# Patient Record
Sex: Female | Born: 1965
Health system: Southern US, Community
[De-identification: ages and names within clinical notes are randomized; demographics above are authoritative.]

## PROBLEM LIST (undated history)

## (undated) DIAGNOSIS — I10 Essential (primary) hypertension: Secondary | ICD-10-CM

## (undated) DIAGNOSIS — K5792 Diverticulitis of intestine, part unspecified, without perforation or abscess without bleeding: Secondary | ICD-10-CM

## (undated) DIAGNOSIS — G4730 Sleep apnea, unspecified: Secondary | ICD-10-CM

## (undated) DIAGNOSIS — J45909 Unspecified asthma, uncomplicated: Secondary | ICD-10-CM

## (undated) HISTORY — PX: TEMPOROMANDIBULAR JOINT SURGERY: SHX35

## (undated) HISTORY — PX: APPENDECTOMY: SHX54

## (undated) HISTORY — PX: OTHER SURGICAL HISTORY: SHX169

## (undated) HISTORY — PX: CHOLECYSTECTOMY: SHX55

---

## 1991-10-15 DIAGNOSIS — J45909 Unspecified asthma, uncomplicated: Secondary | ICD-10-CM | POA: Insufficient documentation

## 2015-05-17 HISTORY — PX: NECK SURGERY: SHX720

## 2018-11-22 DIAGNOSIS — E785 Hyperlipidemia, unspecified: Secondary | ICD-10-CM | POA: Diagnosis not present

## 2018-11-22 DIAGNOSIS — G4733 Obstructive sleep apnea (adult) (pediatric): Secondary | ICD-10-CM | POA: Diagnosis not present

## 2018-11-22 DIAGNOSIS — I10 Essential (primary) hypertension: Secondary | ICD-10-CM | POA: Diagnosis not present

## 2018-11-22 DIAGNOSIS — D509 Iron deficiency anemia, unspecified: Secondary | ICD-10-CM | POA: Diagnosis not present

## 2018-11-22 DIAGNOSIS — R7301 Impaired fasting glucose: Secondary | ICD-10-CM | POA: Diagnosis not present

## 2018-11-22 MED FILL — CARTIA XT 180 MG CAPSULE SA: 180 | 90 days supply | Qty: 90 | Fill #0

## 2018-12-17 ENCOUNTER — Other Ambulatory Visit: Payer: Self-pay

## 2018-12-17 DIAGNOSIS — Z20822 Contact with and (suspected) exposure to covid-19: Secondary | ICD-10-CM

## 2018-12-17 DIAGNOSIS — R6889 Other general symptoms and signs: Secondary | ICD-10-CM | POA: Diagnosis not present

## 2018-12-18 LAB — NOVEL CORONAVIRUS, NAA: SARS-CoV-2, NAA: NOT DETECTED

## 2019-01-07 MED FILL — CARTIA XT 180 MG CAPSULE SA: 180 | 90 days supply | Qty: 90 | Fill #0

## 2019-01-14 ENCOUNTER — Other Ambulatory Visit (HOSPITAL_COMMUNITY)
Admission: RE | Admit: 2019-01-14 | Discharge: 2019-01-14 | Disposition: A | Discharge status: Home / Self Care | Payer: 59 | Source: Ambulatory Visit | Attending: Internal Medicine | Admitting: Internal Medicine

## 2019-01-14 ENCOUNTER — Other Ambulatory Visit: Payer: Self-pay

## 2019-01-14 DIAGNOSIS — R7301 Impaired fasting glucose: Secondary | ICD-10-CM | POA: Diagnosis not present

## 2019-01-14 DIAGNOSIS — D509 Iron deficiency anemia, unspecified: Secondary | ICD-10-CM | POA: Diagnosis not present

## 2019-01-14 LAB — GLUCOSE, RANDOM: Glucose, Bld: 103 mg/dL — ABNORMAL HIGH (ref 70–99)

## 2019-01-14 LAB — CBC
HCT: 38 % (ref 36.0–46.0)
Hemoglobin: 11.7 g/dL — ABNORMAL LOW (ref 12.0–15.0)
MCH: 25.5 pg — ABNORMAL LOW (ref 26.0–34.0)
MCHC: 30.8 g/dL (ref 30.0–36.0)
MCV: 83 fL (ref 80.0–100.0)
Platelets: 278 10*3/uL (ref 150–400)
RBC: 4.58 MIL/uL (ref 3.87–5.11)
RDW: 18.1 % — ABNORMAL HIGH (ref 11.5–15.5)
WBC: 6.3 10*3/uL (ref 4.0–10.5)
nRBC: 0 % (ref 0.0–0.2)

## 2019-01-14 LAB — FERRITIN: Ferritin: 16 ng/mL (ref 11–307)

## 2019-01-14 LAB — HEMOGLOBIN A1C
Hgb A1c MFr Bld: 6 % — ABNORMAL HIGH (ref 4.8–5.6)
Mean Plasma Glucose: 125.5 mg/dL

## 2019-01-15 DIAGNOSIS — R7301 Impaired fasting glucose: Secondary | ICD-10-CM | POA: Diagnosis not present

## 2019-01-15 DIAGNOSIS — D259 Leiomyoma of uterus, unspecified: Secondary | ICD-10-CM | POA: Diagnosis not present

## 2019-01-15 DIAGNOSIS — D509 Iron deficiency anemia, unspecified: Secondary | ICD-10-CM | POA: Diagnosis not present

## 2019-03-26 MED FILL — CARTIA XT 180 MG CAPSULE SA: 180 | 90 days supply | Qty: 90 | Fill #1

## 2019-04-19 ENCOUNTER — Other Ambulatory Visit (HOSPITAL_COMMUNITY)
Admission: RE | Admit: 2019-04-19 | Discharge: 2019-04-19 | Disposition: A | Discharge status: Home / Self Care | Payer: 59 | Source: Ambulatory Visit | Attending: Internal Medicine | Admitting: Internal Medicine

## 2019-04-19 DIAGNOSIS — D509 Iron deficiency anemia, unspecified: Secondary | ICD-10-CM | POA: Diagnosis not present

## 2019-04-19 DIAGNOSIS — E785 Hyperlipidemia, unspecified: Secondary | ICD-10-CM | POA: Insufficient documentation

## 2019-04-19 DIAGNOSIS — J45909 Unspecified asthma, uncomplicated: Secondary | ICD-10-CM | POA: Diagnosis not present

## 2019-04-19 LAB — CBC
HCT: 38 % (ref 36.0–46.0)
Hemoglobin: 12.3 g/dL (ref 12.0–15.0)
MCH: 28.1 pg (ref 26.0–34.0)
MCHC: 32.4 g/dL (ref 30.0–36.0)
MCV: 86.8 fL (ref 80.0–100.0)
Platelets: 260 10*3/uL (ref 150–400)
RBC: 4.38 MIL/uL (ref 3.87–5.11)
RDW: 13.5 % (ref 11.5–15.5)
WBC: 5.9 10*3/uL (ref 4.0–10.5)
nRBC: 0 % (ref 0.0–0.2)

## 2019-04-19 LAB — HEMOGLOBIN A1C
Hgb A1c MFr Bld: 6.1 % — ABNORMAL HIGH (ref 4.8–5.6)
Mean Plasma Glucose: 128.37 mg/dL

## 2019-04-19 LAB — FERRITIN: Ferritin: 27 ng/mL (ref 11–307)

## 2019-04-23 DIAGNOSIS — Z6833 Body mass index (BMI) 33.0-33.9, adult: Secondary | ICD-10-CM | POA: Diagnosis not present

## 2019-04-23 DIAGNOSIS — R7301 Impaired fasting glucose: Secondary | ICD-10-CM | POA: Diagnosis not present

## 2019-04-23 DIAGNOSIS — N926 Irregular menstruation, unspecified: Secondary | ICD-10-CM | POA: Diagnosis not present

## 2019-04-23 DIAGNOSIS — Z01419 Encounter for gynecological examination (general) (routine) without abnormal findings: Secondary | ICD-10-CM | POA: Diagnosis not present

## 2019-04-23 DIAGNOSIS — D509 Iron deficiency anemia, unspecified: Secondary | ICD-10-CM | POA: Diagnosis not present

## 2019-04-23 MED FILL — NORETHINDRONE 5 MG TABLET: 5 | 90 days supply | Qty: 180 | Fill #0

## 2019-04-23 MED FILL — NORETHINDRONE 5 MG TABLET: 5 | 90 days supply | Qty: 180 | Fill #0 | Status: TO

## 2019-06-17 ENCOUNTER — Other Ambulatory Visit: Payer: Self-pay

## 2019-06-17 ENCOUNTER — Other Ambulatory Visit: Payer: Self-pay | Admitting: Internal Medicine

## 2019-06-17 ENCOUNTER — Other Ambulatory Visit (HOSPITAL_COMMUNITY): Payer: Self-pay | Admitting: Internal Medicine

## 2019-06-17 ENCOUNTER — Ambulatory Visit (HOSPITAL_COMMUNITY)
Admission: RE | Admit: 2019-06-17 | Discharge: 2019-06-17 | Disposition: A | Discharge status: Home / Self Care | Payer: 59 | Source: Ambulatory Visit | Attending: Internal Medicine | Admitting: Internal Medicine

## 2019-06-17 DIAGNOSIS — K5720 Diverticulitis of large intestine with perforation and abscess without bleeding: Secondary | ICD-10-CM | POA: Diagnosis not present

## 2019-06-17 DIAGNOSIS — K5732 Diverticulitis of large intestine without perforation or abscess without bleeding: Secondary | ICD-10-CM | POA: Diagnosis not present

## 2019-06-17 DIAGNOSIS — K5792 Diverticulitis of intestine, part unspecified, without perforation or abscess without bleeding: Secondary | ICD-10-CM

## 2019-06-17 DIAGNOSIS — D259 Leiomyoma of uterus, unspecified: Secondary | ICD-10-CM | POA: Diagnosis not present

## 2019-06-17 LAB — POCT I-STAT CREATININE: Creatinine, Ser: 0.9 mg/dL (ref 0.44–1.00)

## 2019-06-17 MED ORDER — IOHEXOL 300 MG/ML  SOLN
100.0000 mL | Freq: Once | INTRAMUSCULAR | Status: AC | PRN
  Administered 2019-06-17: 100 mL via INTRAVENOUS

## 2019-06-17 MED ORDER — IOHEXOL 9 MG/ML PO SOLN: 500.0000 mL | ORAL | Status: AC

## 2019-07-12 MED FILL — DILTIAZEM HCL ER COATED BEA: 180 | 90 days supply | Qty: 90 | Fill #2

## 2019-08-12 MED FILL — NORETHINDRONE 5 MG TABLET: 5 | 90 days supply | Qty: 180 | Fill #1

## 2019-08-19 ENCOUNTER — Other Ambulatory Visit: Payer: Self-pay

## 2019-08-19 ENCOUNTER — Other Ambulatory Visit (HOSPITAL_COMMUNITY)
Admission: RE | Admit: 2019-08-19 | Discharge: 2019-08-19 | Disposition: A | Discharge status: Home / Self Care | Payer: 59 | Source: Ambulatory Visit | Attending: Internal Medicine | Admitting: Internal Medicine

## 2019-08-19 DIAGNOSIS — D509 Iron deficiency anemia, unspecified: Secondary | ICD-10-CM | POA: Diagnosis not present

## 2019-08-19 LAB — CBC
HCT: 41.7 % (ref 36.0–46.0)
Hemoglobin: 13.6 g/dL (ref 12.0–15.0)
MCH: 28.8 pg (ref 26.0–34.0)
MCHC: 32.6 g/dL (ref 30.0–36.0)
MCV: 88.3 fL (ref 80.0–100.0)
Platelets: 292 10*3/uL (ref 150–400)
RBC: 4.72 MIL/uL (ref 3.87–5.11)
RDW: 13 % (ref 11.5–15.5)
WBC: 5.3 10*3/uL (ref 4.0–10.5)
nRBC: 0 % (ref 0.0–0.2)

## 2019-08-19 LAB — FERRITIN: Ferritin: 25 ng/mL (ref 11–307)

## 2019-08-22 DIAGNOSIS — B009 Herpesviral infection, unspecified: Secondary | ICD-10-CM | POA: Diagnosis not present

## 2019-08-22 DIAGNOSIS — D509 Iron deficiency anemia, unspecified: Secondary | ICD-10-CM | POA: Diagnosis not present

## 2019-10-11 ENCOUNTER — Telehealth: Payer: Self-pay | Admitting: Obstetrics & Gynecology

## 2019-10-11 NOTE — Telephone Encounter (Signed)
Left voicemail for patient to notify her that we have canceled her pap/phys on 6/1 due to some unforseen changes in the schedule; in needing to make room for high risk OB apts. Asked patient to contact the office at her earliest convenience to r/s the apt.

## 2019-10-15 ENCOUNTER — Other Ambulatory Visit: Payer: 59 | Admitting: Obstetrics & Gynecology

## 2019-10-27 ENCOUNTER — Emergency Department (HOSPITAL_COMMUNITY)
Admission: EM | Admit: 2019-10-27 | Discharge: 2019-10-27 | Disposition: A | Discharge status: Home / Self Care | Payer: 59 | Attending: Emergency Medicine | Admitting: Emergency Medicine

## 2019-10-27 ENCOUNTER — Other Ambulatory Visit: Payer: Self-pay

## 2019-10-27 ENCOUNTER — Encounter (HOSPITAL_COMMUNITY): Payer: Self-pay | Admitting: Emergency Medicine

## 2019-10-27 DIAGNOSIS — K5792 Diverticulitis of intestine, part unspecified, without perforation or abscess without bleeding: Secondary | ICD-10-CM | POA: Insufficient documentation

## 2019-10-27 DIAGNOSIS — J45909 Unspecified asthma, uncomplicated: Secondary | ICD-10-CM | POA: Diagnosis not present

## 2019-10-27 DIAGNOSIS — Z87891 Personal history of nicotine dependence: Secondary | ICD-10-CM | POA: Insufficient documentation

## 2019-10-27 DIAGNOSIS — R1032 Left lower quadrant pain: Secondary | ICD-10-CM | POA: Diagnosis present

## 2019-10-27 DIAGNOSIS — I10 Essential (primary) hypertension: Secondary | ICD-10-CM | POA: Insufficient documentation

## 2019-10-27 HISTORY — DX: Essential (primary) hypertension: I10

## 2019-10-27 HISTORY — DX: Diverticulitis of intestine, part unspecified, without perforation or abscess without bleeding: K57.92

## 2019-10-27 HISTORY — DX: Unspecified asthma, uncomplicated: J45.909

## 2019-10-27 HISTORY — DX: Sleep apnea, unspecified: G47.30

## 2019-10-27 LAB — CBC WITH DIFFERENTIAL/PLATELET
Abs Immature Granulocytes: 0.03 10*3/uL (ref 0.00–0.07)
Basophils Absolute: 0.1 10*3/uL (ref 0.0–0.1)
Basophils Relative: 1 %
Eosinophils Absolute: 0.2 10*3/uL (ref 0.0–0.5)
Eosinophils Relative: 2 %
HCT: 41.3 % (ref 36.0–46.0)
Hemoglobin: 13.7 g/dL (ref 12.0–15.0)
Immature Granulocytes: 0 %
Lymphocytes Relative: 19 %
Lymphs Abs: 2.1 10*3/uL (ref 0.7–4.0)
MCH: 28.9 pg (ref 26.0–34.0)
MCHC: 33.2 g/dL (ref 30.0–36.0)
MCV: 87.1 fL (ref 80.0–100.0)
Monocytes Absolute: 0.7 10*3/uL (ref 0.1–1.0)
Monocytes Relative: 7 %
Neutro Abs: 7.8 10*3/uL — ABNORMAL HIGH (ref 1.7–7.7)
Neutrophils Relative %: 71 %
Platelets: 282 10*3/uL (ref 150–400)
RBC: 4.74 MIL/uL (ref 3.87–5.11)
RDW: 12.9 % (ref 11.5–15.5)
WBC: 10.9 10*3/uL — ABNORMAL HIGH (ref 4.0–10.5)
nRBC: 0 % (ref 0.0–0.2)

## 2019-10-27 LAB — BASIC METABOLIC PANEL
Anion gap: 9 (ref 5–15)
BUN: 12 mg/dL (ref 6–20)
CO2: 24 mmol/L (ref 22–32)
Calcium: 8.9 mg/dL (ref 8.9–10.3)
Chloride: 105 mmol/L (ref 98–111)
Creatinine, Ser: 0.89 mg/dL (ref 0.44–1.00)
GFR calc Af Amer: >60 mL/min (ref >60)
GFR calc non Af Amer: >60 mL/min (ref >60)
Glucose, Bld: 115 mg/dL — ABNORMAL HIGH (ref 70–99)
Potassium: 3.5 mmol/L (ref 3.5–5.1)
Sodium: 138 mmol/L (ref 135–145)

## 2019-10-27 MED ORDER — METRONIDAZOLE IN NACL 5-0.79 MG/ML-% IV SOLN
500.0000 mg | Freq: Once | INTRAVENOUS | Status: AC
  Administered 2019-10-27: 500 mg via INTRAVENOUS
  Filled 2019-10-27: qty 100

## 2019-10-27 MED ORDER — METRONIDAZOLE 500 MG PO TABS: 500.0000 mg | ORAL_TABLET | Freq: Three times a day (TID) | ORAL | 2 refills | Status: DC

## 2019-10-27 MED ORDER — ONDANSETRON HCL 4 MG PO TABS: 4.0000 mg | ORAL_TABLET | Freq: Four times a day (QID) | ORAL | 0 refills | Status: DC | PRN

## 2019-10-27 MED ORDER — CIPROFLOXACIN HCL 500 MG PO TABS: 500.0000 mg | ORAL_TABLET | Freq: Two times a day (BID) | ORAL | 2 refills | Status: DC

## 2019-10-27 MED ORDER — LACTATED RINGERS IV BOLUS
1000.0000 mL | Freq: Once | INTRAVENOUS | Status: AC
  Administered 2019-10-27: 1000 mL via INTRAVENOUS

## 2019-10-27 MED ORDER — CIPROFLOXACIN IN D5W 400 MG/200ML IV SOLN
400.0000 mg | Freq: Once | INTRAVENOUS | Status: AC
  Administered 2019-10-27: 400 mg via INTRAVENOUS
  Filled 2019-10-27: qty 200

## 2019-10-27 MED ORDER — KETOROLAC TROMETHAMINE 30 MG/ML IJ SOLN
30.0000 mg | Freq: Once | INTRAMUSCULAR | Status: AC
  Administered 2019-10-27: 30 mg via INTRAVENOUS
  Filled 2019-10-27: qty 1

## 2019-10-27 NOTE — ED Triage Notes (Signed)
Pt with LLQ abdominal pain that has started to spread across lower abdomen. Pt believes her "diverticulitis is back".

## 2019-10-27 NOTE — ED Provider Notes (Signed)
[facility] EMERGENCY DEPARTMENT Provider Note   CSN: [id number] Arrival date & time: 10/27/19  5027     History Chief Complaint  Patient presents with  . Abdominal Pain    Gina Whitaker is a 54 y.o. female.  Patient presents to the emergency department for evaluation of abdominal pain.  Patient reports that she started experiencing lower back pain several days ago which she initially thought might have been a strain from moving patients at work.  She then, however, started having urinary frequency and bladder irritation followed by onset of left lower quadrant abdominal pain.  Patient reports that she now recalls that this is the same sequence she experienced when she had her first episode of diverticulitis in February of this year.  Patient reports that the pain has progressively worsened, a burning, sharp pain mostly in the left lower quadrant that radiates across to the right.  Patient experiencing nausea associated with the pain.        Past Medical History:  Diagnosis Date  . Asthma   . Diverticulitis   . Hypertension   . Sleep apnea     There are no problems to display for this patient.   Past Surgical History:  Procedure Laterality Date  . APPENDECTOMY    . CHOLECYSTECTOMY       OB History   No obstetric history on file.     History reviewed. No pertinent family history.  Social History   Tobacco Use  . Smoking status: Former Smoker    Types: Cigarettes    Quit date: 1994    Years since quitting: 27.4  . Smokeless tobacco: Never Used  Vaping Use  . Vaping Use: Never used  Substance Use Topics  . Alcohol use: Never  . Drug use: Never    Home Medications Prior to Admission medications   Medication Sig Start Date End Date Taking? Authorizing Provider  ciprofloxacin (CIPRO) 500 MG tablet Take 1 tablet (500 mg total) by mouth 2 (two) times daily. 10/27/19   Orpah Greek, MD  metroNIDAZOLE (FLAGYL) 500 MG tablet Take 1 tablet (500 mg  total) by mouth 3 (three) times daily. 10/27/19   Orpah Greek, MD  ondansetron (ZOFRAN) 4 MG tablet Take 1 tablet (4 mg total) by mouth every 6 (six) hours as needed for nausea or vomiting. 10/27/19   Erbie Arment, Gwenyth Allegra, MD    Allergies    Tramadol and Codeine  Review of Systems   Review of Systems  Gastrointestinal: Positive for abdominal pain and nausea.  All other systems reviewed and are negative.   Physical Exam Updated Vital Signs LMP 10/13/2019 (Approximate)   Physical Exam Vitals and nursing note reviewed.  Constitutional:      General: She is not in acute distress.    Appearance: Normal appearance. She is well-developed.  HENT:     Head: Normocephalic and atraumatic.     Right Ear: Hearing normal.     Left Ear: Hearing normal.     Nose: Nose normal.  Eyes:     Conjunctiva/sclera: Conjunctivae normal.     Pupils: Pupils are equal, round, and reactive to light.  Cardiovascular:     Rate and Rhythm: Regular rhythm.     Heart sounds: S1 normal and S2 normal. No murmur heard.  No friction rub. No gallop.   Pulmonary:     Effort: Pulmonary effort is normal. No respiratory distress.     Breath sounds: Normal breath sounds.  Chest:  Chest wall: No tenderness.  Abdominal:     General: Bowel sounds are normal.     Palpations: Abdomen is soft.     Tenderness: There is abdominal tenderness in the left lower quadrant. There is guarding. There is no rebound. Negative signs include Murphy's sign and McBurney's sign.     Hernia: No hernia is present.  Musculoskeletal:        General: Normal range of motion.     Cervical back: Normal range of motion and neck supple.  Skin:    General: Skin is warm and dry.     Findings: No rash.  Neurological:     Mental Status: She is alert and oriented to person, place, and time.     GCS: GCS eye subscore is 4. GCS verbal subscore is 5. GCS motor subscore is 6.     Cranial Nerves: No cranial nerve deficit.     Sensory:  No sensory deficit.     Coordination: Coordination normal.  Psychiatric:        Speech: Speech normal.        Behavior: Behavior normal.        Thought Content: Thought content normal.     ED Results / Procedures / Treatments   Labs (all labs ordered are listed, but only abnormal results are displayed) Labs Reviewed  CBC WITH DIFFERENTIAL/PLATELET  BASIC METABOLIC PANEL  URINALYSIS, ROUTINE W REFLEX MICROSCOPIC    EKG None  Radiology No results found.  Procedures Procedures (including critical care time)  Medications Ordered in ED Medications  lactated ringers bolus 1,000 mL (has no administration in time range)  ketorolac (TORADOL) 30 MG/ML injection 30 mg (has no administration in time range)  ciprofloxacin (CIPRO) IVPB 400 mg (has no administration in time range)  metroNIDAZOLE (FLAGYL) IVPB 500 mg (has no administration in time range)    ED Course  I have reviewed the triage vital signs and the nursing notes.  Pertinent labs & imaging results that were available during my care of the patient were reviewed by me and considered in my medical decision making (see chart for details).    MDM Rules/Calculators/A&P                          Patient presents to the emergency department for evaluation of left lower quadrant abdominal pain.  Symptoms are consistent with acute diverticulitis.  Discussed risks and benefits of CT scan.  Symptoms have been present for approximately 2 days and therefore I doubt perforation or abscess formation.  She does not have any signs of peritonitis on examination.  She would prefer not to have a CAT scan to confirm diagnosis at this time.  We will therefore empirically treat with Cipro and Flagyl, analgesia.  Patient will return to the ER for further evaluation if pain does not improve or her symptoms worsen.  Final Clinical Impression(s) / ED Diagnoses Final diagnoses:  Diverticulitis    Rx / DC Orders ED Discharge Orders          Ordered    ciprofloxacin (CIPRO) 500 MG tablet  2 times daily     Discontinue  Reprint     10/27/19 0600    metroNIDAZOLE (FLAGYL) 500 MG tablet  3 times daily     Discontinue  Reprint     10/27/19 0600    ondansetron (ZOFRAN) 4 MG tablet  Every 6 hours PRN     Discontinue  Reprint  10/27/19 0600           Orpah Greek, MD 10/27/19 0600

## 2019-10-31 DIAGNOSIS — K5700 Diverticulitis of small intestine with perforation and abscess without bleeding: Secondary | ICD-10-CM | POA: Diagnosis not present

## 2019-11-05 ENCOUNTER — Encounter: Payer: Self-pay | Admitting: Obstetrics & Gynecology

## 2019-11-05 ENCOUNTER — Ambulatory Visit (INDEPENDENT_AMBULATORY_CARE_PROVIDER_SITE_OTHER): Payer: 59 | Admitting: Obstetrics & Gynecology

## 2019-11-05 VITALS — BP 131/67 | HR 80 | Ht 65.0 in | Wt 183.5 lb

## 2019-11-05 DIAGNOSIS — N951 Menopausal and female climacteric states: Secondary | ICD-10-CM | POA: Diagnosis not present

## 2019-11-05 MED ORDER — ESTRADIOL 0.1 MG/24HR TD PTTW: 1.0000 | MEDICATED_PATCH | TRANSDERMAL | 12 refills | Status: DC

## 2019-11-05 MED ORDER — PROGESTERONE 200 MG PO CAPS: 200.0000 mg | ORAL_CAPSULE | Freq: Every day | ORAL | 11 refills | Status: DC

## 2019-11-05 MED FILL — PROGESTERONE MICRONIZED 200: 200 | 30 days supply | Qty: 30 | Fill #0

## 2019-11-05 MED FILL — ESTRADIOL 0.1 MG PATCH: 0.1 | 28 days supply | Qty: 8 | Fill #0

## 2019-11-05 NOTE — Progress Notes (Signed)
Chief Complaint  Patient presents with  . Gynecologic Exam      54 y.o. [id number] Patient's last menstrual period was 09/21/2019. The current method of family planning is vasectomy.  Outpatient Encounter Medications as of 11/05/2019  Medication Sig  . acyclovir (ZOVIRAX) 400 MG tablet Take 400 mg by mouth 5 (five) times daily.  Marland Kitchen albuterol (VENTOLIN HFA) 108 (90 Base) MCG/ACT inhaler Inhale into the lungs.  . cetirizine (ZYRTEC) 10 MG tablet Take 10 mg by mouth daily.  . ciprofloxacin (CIPRO) 500 MG tablet Take 1 tablet (500 mg total) by mouth 2 (two) times daily.  Marland Kitchen diltiazem (CARDIZEM CD) 180 MG 24 hr capsule Take 180 mg by mouth daily.  . Famotidine (PEPCID PO) Take by mouth in the morning.  . fluticasone (FLONASE) 50 MCG/ACT nasal spray 2 sprays by Each Nare route daily.  . metroNIDAZOLE (FLAGYL) 500 MG tablet Take 1 tablet (500 mg total) by mouth 3 (three) times daily.  . norethindrone (AYGESTIN) 5 MG tablet Take 10 mg by mouth daily.  . ondansetron (ZOFRAN) 4 MG tablet Take 1 tablet (4 mg total) by mouth every 6 (six) hours as needed for nausea or vomiting.  . rizatriptan (MAXALT) 10 MG tablet Take 10 mg by mouth daily as needed.  Derrill Memo ON 11/07/2019] estradiol (VIVELLE-DOT) 0.1 MG/24HR patch Place 1 patch (0.1 mg total) onto the skin 2 (two) times a week.  . progesterone (PROMETRIUM) 200 MG capsule Take 1 capsule (200 mg total) by mouth daily. nightly   No facility-administered encounter medications on file as of 11/05/2019.    Subjective Pt has approx 2 years of vasomotor symptoms emotional changes sleep disturbance sexaual  Dysfunction Had an ablation 3/20 for perimenopausal bleeding issues, hemoglobin 7 Symptoms seem to be increasing and not waning Also with SUI and some pad related vulvitis  Past Medical History:  Diagnosis Date  . Asthma   . Diverticulitis   . Hypertension   . Sleep apnea     Past Surgical History:  Procedure Laterality Date  .  APPENDECTOMY    . CHOLECYSTECTOMY    . endometrial ablation      OB History    Gravida  3   Para  2   Term  1   Preterm  1   AB  1   Living  2     SAB  1   TAB      Ectopic      Multiple      Live Births  2           Allergies  Allergen Reactions  . Hydrocodone Rash and Swelling  . Tramadol     Rash and facial swelling  . Codeine     Rash and Itching  . Erythromycin   . Morphine   . Penicillins Rash    Social History   Socioeconomic History  . Marital status: Married    Spouse name: Not on file  . Number of children: Not on file  . Years of education: Not on file  . Highest education level: Not on file  Occupational History  . Not on file  Tobacco Use  . Smoking status: Former Smoker    Types: Cigarettes    Quit date: 1994    Years since quitting: 27.4  . Smokeless tobacco: Never Used  Vaping Use  . Vaping Use: Never used  Substance and Sexual Activity  . Alcohol use: Never  . Drug use:  Never  . Sexual activity: Not Currently    Birth control/protection: Surgical    Comment: ablation  Other Topics Concern  . Not on file  Social History Narrative  . Not on file   Social Determinants of Health   Financial Resource Strain: Low Risk   . Difficulty of Paying Living Expenses: Not hard at all  Food Insecurity: No Food Insecurity  . Worried About Charity fundraiser in the Last Year: Never true  . Ran Out of Food in the Last Year: Never true  Transportation Needs: No Transportation Needs  . Lack of Transportation (Medical): No  . Lack of Transportation (Non-Medical): No  Physical Activity: Insufficiently Active  . Days of Exercise per Week: 2 days  . Minutes of Exercise per Session: 20 min  Stress: No Stress Concern Present  . Feeling of Stress : Only a little  Social Connections: Moderately Integrated  . Frequency of Communication with Friends and Family: Twice a week  . Frequency of Social Gatherings with Friends and Family: Once a  week  . Attends Religious Services: More than 4 times per year  . Active Member of Clubs or Organizations: No  . Attends Archivist Meetings: Never  . Marital Status: Married    Family History  Problem Relation Age of Onset  . Congestive Heart Failure Paternal Grandfather   . Cancer Paternal Grandmother        pancreatic  . Aneurysm Maternal Grandmother   . Heart attack Maternal Grandfather   . Hypertension Father   . Basal cell carcinoma Father   . Cancer Father        prostate  . Hypertension Mother   . Melanoma Mother   . High Cholesterol Mother   . High Cholesterol Brother   . Other Brother   . Asthma Daughter   . Polycystic ovary syndrome Daughter     Medications:       Current Outpatient Medications:  .  acyclovir (ZOVIRAX) 400 MG tablet, Take 400 mg by mouth 5 (five) times daily., Disp: , Rfl:  .  albuterol (VENTOLIN HFA) 108 (90 Base) MCG/ACT inhaler, Inhale into the lungs., Disp: , Rfl:  .  cetirizine (ZYRTEC) 10 MG tablet, Take 10 mg by mouth daily., Disp: , Rfl:  .  ciprofloxacin (CIPRO) 500 MG tablet, Take 1 tablet (500 mg total) by mouth 2 (two) times daily., Disp: 20 tablet, Rfl: 2 .  diltiazem (CARDIZEM CD) 180 MG 24 hr capsule, Take 180 mg by mouth daily., Disp: , Rfl:  .  Famotidine (PEPCID PO), Take by mouth in the morning., Disp: , Rfl:  .  fluticasone (FLONASE) 50 MCG/ACT nasal spray, 2 sprays by Each Nare route daily., Disp: , Rfl:  .  metroNIDAZOLE (FLAGYL) 500 MG tablet, Take 1 tablet (500 mg total) by mouth 3 (three) times daily., Disp: 30 tablet, Rfl: 2 .  norethindrone (AYGESTIN) 5 MG tablet, Take 10 mg by mouth daily., Disp: , Rfl:  .  ondansetron (ZOFRAN) 4 MG tablet, Take 1 tablet (4 mg total) by mouth every 6 (six) hours as needed for nausea or vomiting., Disp: 20 tablet, Rfl: 0 .  rizatriptan (MAXALT) 10 MG tablet, Take 10 mg by mouth daily as needed., Disp: , Rfl:  .  [START ON 11/07/2019] estradiol (VIVELLE-DOT) 0.1 MG/24HR patch,  Place 1 patch (0.1 mg total) onto the skin 2 (two) times a week., Disp: 8 patch, Rfl: 12 .  progesterone (PROMETRIUM) 200 MG capsule, Take 1 capsule (  200 mg total) by mouth daily. nightly, Disp: 20 capsule, Rfl: 11  Objective Blood pressure 131/67, pulse 80, height 5\' 5"  (1.651 m), weight 183 lb 8 oz (83.2 kg), last menstrual period 09/21/2019.  General WDWN female NAD Vulva:  normal appearing vulva with no masses, tenderness or lesions Vagina:  normal mucosa, no discharge Cervix:  Normal no lesions Uterus:  normal size, contour, position, consistency, mobility, non-tender Adnexa: ovaries:present,  normal adnexa in size, nontender and no masses   Pertinent ROS Per HPI  Labs or studies No new    Impression Diagnoses this Encounter::   ICD-10-CM   1. Perimenopausal symptoms  N95.1    2 years of vasomotor symptoms, night sweats, sleep disturbance and sexual dysfunction    Established relevant diagnosis(es):   Plan/Recommendations: Meds ordered this encounter  Medications  . estradiol (VIVELLE-DOT) 0.1 MG/24HR patch    Sig: Place 1 patch (0.1 mg total) onto the skin 2 (two) times a week.    Dispense:  8 patch    Refill:  12  . progesterone (PROMETRIUM) 200 MG capsule    Sig: Take 1 capsule (200 mg total) by mouth daily. nightly    Dispense:  20 capsule    Refill:  11    Labs or Scans Ordered: No orders of the defined types were placed in this encounter.   Management:: Topical ERT + prometrium 200 qhs Topical zinc oxide for pad related vulvitis Hopefully ERT will help that as well, indirectly thru improved tissue    Follow up Return in about 3 months (around 02/05/2020) for [place] visit, with Dr Elonda Husky.   All questions were answered.

## 2019-11-28 ENCOUNTER — Encounter (INDEPENDENT_AMBULATORY_CARE_PROVIDER_SITE_OTHER): Payer: Self-pay | Admitting: *Deleted

## 2019-12-10 MED FILL — PROGESTERONE MICRONIZED 200: 200 | 30 days supply | Qty: 30 | Fill #1

## 2019-12-10 MED FILL — ESTRADIOL 0.1 MG PATCH: 0.1 | 28 days supply | Qty: 8 | Fill #1

## 2019-12-17 ENCOUNTER — Other Ambulatory Visit (HOSPITAL_COMMUNITY)
Admission: RE | Admit: 2019-12-17 | Discharge: 2019-12-17 | Disposition: A | Discharge status: Home / Self Care | Payer: 59 | Source: Ambulatory Visit | Attending: Internal Medicine | Admitting: Internal Medicine

## 2019-12-17 DIAGNOSIS — R7301 Impaired fasting glucose: Secondary | ICD-10-CM | POA: Diagnosis not present

## 2019-12-17 DIAGNOSIS — D509 Iron deficiency anemia, unspecified: Secondary | ICD-10-CM | POA: Diagnosis not present

## 2019-12-17 DIAGNOSIS — E785 Hyperlipidemia, unspecified: Secondary | ICD-10-CM | POA: Diagnosis not present

## 2019-12-17 LAB — COMPREHENSIVE METABOLIC PANEL
ALT: 18 U/L (ref 0–44)
AST: 12 U/L — ABNORMAL LOW (ref 15–41)
Albumin: 3.8 g/dL (ref 3.5–5.0)
Alkaline Phosphatase: 51 U/L (ref 38–126)
Anion gap: 8 (ref 5–15)
BUN: 15 mg/dL (ref 6–20)
CO2: 24 mmol/L (ref 22–32)
Calcium: 8.6 mg/dL — ABNORMAL LOW (ref 8.9–10.3)
Chloride: 107 mmol/L (ref 98–111)
Creatinine, Ser: 0.83 mg/dL (ref 0.44–1.00)
GFR calc Af Amer: >60 mL/min (ref >60)
GFR calc non Af Amer: >60 mL/min (ref >60)
Glucose, Bld: 98 mg/dL (ref 70–99)
Potassium: 3.9 mmol/L (ref 3.5–5.1)
Sodium: 139 mmol/L (ref 135–145)
Total Bilirubin: 0.7 mg/dL (ref 0.3–1.2)
Total Protein: 6.7 g/dL (ref 6.5–8.1)

## 2019-12-17 LAB — CBC
HCT: 39 % (ref 36.0–46.0)
Hemoglobin: 12.6 g/dL (ref 12.0–15.0)
MCH: 28.5 pg (ref 26.0–34.0)
MCHC: 32.3 g/dL (ref 30.0–36.0)
MCV: 88.2 fL (ref 80.0–100.0)
Platelets: 265 10*3/uL (ref 150–400)
RBC: 4.42 MIL/uL (ref 3.87–5.11)
RDW: 13.2 % (ref 11.5–15.5)
WBC: 5.7 10*3/uL (ref 4.0–10.5)
nRBC: 0 % (ref 0.0–0.2)

## 2019-12-17 LAB — URINALYSIS, ROUTINE W REFLEX MICROSCOPIC
Bilirubin Urine: NEGATIVE
Glucose, UA: NEGATIVE mg/dL
Ketones, ur: NEGATIVE mg/dL
Leukocytes,Ua: NEGATIVE
Nitrite: NEGATIVE
Protein, ur: 30 mg/dL — AB
Specific Gravity, Urine: 1.024 (ref 1.005–1.030)
pH: 7 (ref 5.0–8.0)

## 2019-12-17 LAB — FERRITIN: Ferritin: 20 ng/mL (ref 11–307)

## 2019-12-17 LAB — HEMOGLOBIN A1C
Hgb A1c MFr Bld: 5.8 % — ABNORMAL HIGH (ref 4.8–5.6)
Mean Plasma Glucose: 119.76 mg/dL

## 2019-12-17 LAB — LIPID PANEL
Cholesterol: 194 mg/dL (ref 0–200)
HDL: 35 mg/dL — ABNORMAL LOW (ref >40)
LDL Cholesterol: 132 mg/dL — ABNORMAL HIGH (ref 0–99)
Total CHOL/HDL Ratio: 5.5 RATIO
Triglycerides: 137 mg/dL (ref <150)
VLDL: 27 mg/dL (ref 0–40)

## 2019-12-17 LAB — ETHANOL: Alcohol, Ethyl (B): <10 mg/dL (ref <10)

## 2019-12-26 DIAGNOSIS — I10 Essential (primary) hypertension: Secondary | ICD-10-CM | POA: Diagnosis not present

## 2019-12-26 DIAGNOSIS — G4733 Obstructive sleep apnea (adult) (pediatric): Secondary | ICD-10-CM | POA: Diagnosis not present

## 2019-12-26 DIAGNOSIS — R7309 Other abnormal glucose: Secondary | ICD-10-CM | POA: Diagnosis not present

## 2019-12-26 DIAGNOSIS — R945 Abnormal results of liver function studies: Secondary | ICD-10-CM | POA: Diagnosis not present

## 2020-01-02 ENCOUNTER — Other Ambulatory Visit (HOSPITAL_COMMUNITY): Payer: Self-pay | Admitting: Internal Medicine

## 2020-01-02 MED FILL — CARTIA XT 180 MG CAPSULE SA: 180 | 90 days supply | Qty: 90 | Fill #0

## 2020-01-03 DIAGNOSIS — G4733 Obstructive sleep apnea (adult) (pediatric): Secondary | ICD-10-CM | POA: Diagnosis not present

## 2020-01-07 MED FILL — PROGESTERONE MICRONIZED 200: 200 | 30 days supply | Qty: 30 | Fill #2

## 2020-01-07 MED FILL — ESTRADIOL 0.1 MG PATCH: 0.1 | 28 days supply | Qty: 8 | Fill #2

## 2020-01-13 ENCOUNTER — Telehealth (HOSPITAL_COMMUNITY): Payer: Self-pay

## 2020-01-13 NOTE — Telephone Encounter (Signed)
Error wrong chart

## 2020-01-23 ENCOUNTER — Encounter (HOSPITAL_COMMUNITY): Payer: Self-pay | Admitting: Hematology

## 2020-01-23 ENCOUNTER — Inpatient Hospital Stay (HOSPITAL_COMMUNITY): Payer: 59

## 2020-01-23 ENCOUNTER — Inpatient Hospital Stay (HOSPITAL_COMMUNITY): Payer: 59 | Attending: Hematology | Admitting: Hematology

## 2020-01-23 ENCOUNTER — Other Ambulatory Visit: Payer: Self-pay

## 2020-01-23 DIAGNOSIS — G4730 Sleep apnea, unspecified: Secondary | ICD-10-CM | POA: Insufficient documentation

## 2020-01-23 DIAGNOSIS — D500 Iron deficiency anemia secondary to blood loss (chronic): Secondary | ICD-10-CM | POA: Insufficient documentation

## 2020-01-23 DIAGNOSIS — N920 Excessive and frequent menstruation with regular cycle: Secondary | ICD-10-CM | POA: Diagnosis not present

## 2020-01-23 DIAGNOSIS — Z8249 Family history of ischemic heart disease and other diseases of the circulatory system: Secondary | ICD-10-CM | POA: Diagnosis not present

## 2020-01-23 DIAGNOSIS — Z8042 Family history of malignant neoplasm of prostate: Secondary | ICD-10-CM | POA: Diagnosis not present

## 2020-01-23 DIAGNOSIS — G4733 Obstructive sleep apnea (adult) (pediatric): Secondary | ICD-10-CM

## 2020-01-23 DIAGNOSIS — Z800 Family history of malignant neoplasm of digestive organs: Secondary | ICD-10-CM | POA: Diagnosis not present

## 2020-01-23 DIAGNOSIS — D509 Iron deficiency anemia, unspecified: Secondary | ICD-10-CM

## 2020-01-23 DIAGNOSIS — I10 Essential (primary) hypertension: Secondary | ICD-10-CM

## 2020-01-23 DIAGNOSIS — Z87891 Personal history of nicotine dependence: Secondary | ICD-10-CM | POA: Insufficient documentation

## 2020-01-23 LAB — CBC WITH DIFFERENTIAL/PLATELET
Abs Immature Granulocytes: 0.01 10*3/uL (ref 0.00–0.07)
Basophils Absolute: 0.1 10*3/uL (ref 0.0–0.1)
Basophils Relative: 1 %
Eosinophils Absolute: 0.2 10*3/uL (ref 0.0–0.5)
Eosinophils Relative: 3 %
HCT: 40.3 % (ref 36.0–46.0)
Hemoglobin: 13 g/dL (ref 12.0–15.0)
Immature Granulocytes: 0 %
Lymphocytes Relative: 32 %
Lymphs Abs: 1.9 10*3/uL (ref 0.7–4.0)
MCH: 28.8 pg (ref 26.0–34.0)
MCHC: 32.3 g/dL (ref 30.0–36.0)
MCV: 89.2 fL (ref 80.0–100.0)
Monocytes Absolute: 0.5 10*3/uL (ref 0.1–1.0)
Monocytes Relative: 8 %
Neutro Abs: 3.3 10*3/uL (ref 1.7–7.7)
Neutrophils Relative %: 56 %
Platelets: 253 10*3/uL (ref 150–400)
RBC: 4.52 MIL/uL (ref 3.87–5.11)
RDW: 13.3 % (ref 11.5–15.5)
WBC: 5.9 10*3/uL (ref 4.0–10.5)
nRBC: 0 % (ref 0.0–0.2)

## 2020-01-23 LAB — COMPREHENSIVE METABOLIC PANEL
ALT: 21 U/L (ref 0–44)
AST: 16 U/L (ref 15–41)
Albumin: 4 g/dL (ref 3.5–5.0)
Alkaline Phosphatase: 49 U/L (ref 38–126)
Anion gap: 8 (ref 5–15)
BUN: 14 mg/dL (ref 6–20)
CO2: 26 mmol/L (ref 22–32)
Calcium: 8.8 mg/dL — ABNORMAL LOW (ref 8.9–10.3)
Chloride: 103 mmol/L (ref 98–111)
Creatinine, Ser: 0.76 mg/dL (ref 0.44–1.00)
GFR calc Af Amer: >60 mL/min (ref >60)
GFR calc non Af Amer: >60 mL/min (ref >60)
Glucose, Bld: 89 mg/dL (ref 70–99)
Potassium: 3.9 mmol/L (ref 3.5–5.1)
Sodium: 137 mmol/L (ref 135–145)
Total Bilirubin: 0.7 mg/dL (ref 0.3–1.2)
Total Protein: 7.1 g/dL (ref 6.5–8.1)

## 2020-01-23 LAB — RETICULOCYTES
Immature Retic Fract: 4.6 % (ref 2.3–15.9)
RBC.: 4.53 MIL/uL (ref 3.87–5.11)
Retic Count, Absolute: 51.6 10*3/uL (ref 19.0–186.0)
Retic Ct Pct: 1.1 % (ref 0.4–3.1)

## 2020-01-23 LAB — IRON AND TIBC
Iron: 139 ug/dL (ref 28–170)
Saturation Ratios: 34 % — ABNORMAL HIGH (ref 10.4–31.8)
TIBC: 403 ug/dL (ref 250–450)
UIBC: 264 ug/dL

## 2020-01-23 LAB — VITAMIN B12
Vitamin B-12: 181 pg/mL (ref 180–914)
Vitamin B-12: 202 pg/mL (ref 180–914)

## 2020-01-23 LAB — SAVE SMEAR(SSMR), FOR PROVIDER SLIDE REVIEW

## 2020-01-23 LAB — LACTATE DEHYDROGENASE
LDH: 117 U/L (ref 98–192)
LDH: 124 U/L (ref 98–192)

## 2020-01-23 LAB — VITAMIN D 25 HYDROXY (VIT D DEFICIENCY, FRACTURES)
Vit D, 25-Hydroxy: 25.91 ng/mL — ABNORMAL LOW (ref 30–100)
Vit D, 25-Hydroxy: 27.68 ng/mL — ABNORMAL LOW (ref 30–100)

## 2020-01-23 LAB — FERRITIN: Ferritin: 28 ng/mL (ref 11–307)

## 2020-01-23 LAB — FOLATE: Folate: 11.7 ng/mL (ref >5.9)

## 2020-01-23 NOTE — Progress Notes (Signed)
CONSULT NOTE  Patient Care Team: [name], MD as PCP - General (Internal Medicine)  CHIEF COMPLAINTS/PURPOSE OF CONSULTATION: Iron deficiency anemia  HISTORY OF PRESENTING ILLNESS:  Gina Whitaker 54 y.o. female was sent here by her PCP for iron deficiency anemia. Patient has been iron deficient for many years now. Back in 2015 patient had mild rectal bleeding she was referred to GI. Patient had colonoscopy in which they found 2 polyps that were benign. No other source of bleeding. She has recently had 2 bouts of diverticulitis in February 2021 in June 2021. She is scheduling her repeat colonoscopy within the next couple months. Patient was diagnosed in December 2014 with sleep apnea and she is left with a CPAP machine ever since. In March 2020 patient had heavy menstrual bleeding where her hemoglobin dropped to 8. Patient saw her GYN who scheduled an ablation. Patient had 2 large fibroids. They were unable to ablate behind one of the fibroids. She still has bleeding monthly. Ever since her bleed in March 2020 she has never regained her energy levels. Her hemoglobin remains normal however her ferritin levels are low. Patient was placed on oral iron therapy which causes severe nausea and constipation. Patient denies ever needing a blood transfusion. Patient denies any CKD. Patient denies any alcohol smoking or illicit drug use. Patient denies any pica and eats a variety of diet. Patient has a family history of a mother who has anemia and requires iron transfusions regularly. She is also had melanoma. Patient's father had basal cell carcinoma and prostate cancer. Her paternal grandmother had pancreatic cancer. Paternal aunt had ovarian cancer and lymphoma 20 years later. Paternal aunt had breast cancer. She also has a brother who gets regular phlebotomies and has polycythemia. 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 She lives at home and performs all of her own  ADLs.    MEDICAL HISTORY:  Past Medical History:  Diagnosis Date  . Asthma   . Diverticulitis   . Hypertension   . Sleep apnea     SURGICAL HISTORY: Past Surgical History:  Procedure Laterality Date  . APPENDECTOMY    . CHOLECYSTECTOMY    . endometrial ablation    . NECK SURGERY  2017   t lift and fixation  C4 C5 C6 C7   . TEMPOROMANDIBULAR JOINT SURGERY     twice bilateral    SOCIAL HISTORY: Social History   Socioeconomic History  . Marital status: Married    Spouse name: Not on file  . Number of children: 2  . Years of education: Not on file  . Highest education level: Not on file  Occupational History  . Not on file  Tobacco Use  . Smoking status: Former Smoker    Types: Cigarettes    Quit date: 1994    Years since quitting: 27.7  . Smokeless tobacco: Never Used  Vaping Use  . Vaping Use: Never used  Substance and Sexual Activity  . Alcohol use: Never  . Drug use: Never  . Sexual activity: Not Currently    Birth control/protection: Surgical    Comment: ablation  Other Topics Concern  . Not on file  Social History Narrative  . Not on file   Social Determinants of Health   Financial Resource Strain: Low Risk   . Difficulty of Paying Living Expenses: Not hard at all  Food Insecurity: No Food Insecurity  . Worried About Charity fundraiser in the Last  Year: Never true  . Ran Out of Food in the Last Year: Never true  Transportation Needs: No Transportation Needs  . Lack of Transportation (Medical): No  . Lack of Transportation (Non-Medical): No  Physical Activity: Insufficiently Active  . Days of Exercise per Week: 2 days  . Minutes of Exercise per Session: 20 min  Stress: No Stress Concern Present  . Feeling of Stress : Only a little  Social Connections: Moderately Integrated  . Frequency of Communication with Friends and Family: Twice a week  . Frequency of Social Gatherings with Friends and Family: Once a week  . Attends Religious Services:  More than 4 times per year  . Active Member of Clubs or Organizations: No  . Attends Archivist Meetings: Never  . Marital Status: Married  Human resources officer Violence: Not At Risk  . Fear of Current or Ex-Partner: No  . Emotionally Abused: No  . Physically Abused: No  . Sexually Abused: No    FAMILY HISTORY: Family History  Problem Relation Age of Onset  . Congestive Heart Failure Paternal Grandfather   . Heart attack Paternal Grandfather   . Dementia Paternal Grandfather   . Basal cell carcinoma Paternal Grandfather   . Cancer Paternal Grandmother        pancreatic  . Aneurysm Maternal Grandmother   . Hypertension Maternal Grandmother   . CAD Maternal Grandmother   . Heart attack Maternal Grandfather   . Mental illness Maternal Grandfather   . Hypertension Father   . Basal cell carcinoma Father   . Cancer Father        prostate  . Glaucoma Father   . Hypertension Mother   . Melanoma Mother   . High Cholesterol Mother   . Irritable bowel syndrome Mother   . Iron deficiency Mother   . Arthritis Mother   . High Cholesterol Brother   . Hypertension Brother   . Other Brother   . Asthma Daughter   . Polycystic ovary syndrome Daughter     ALLERGIES:  is allergic to hydrocodone, tramadol, codeine, erythromycin, morphine, and penicillins.  MEDICATIONS:  Current Outpatient Medications  Medication Sig Dispense Refill  . albuterol (VENTOLIN HFA) 108 (90 Base) MCG/ACT inhaler Inhale into the lungs.    . Calcium Carbonate-Vitamin D (CALTRATE 600+D PO) Take by mouth daily.    . cetirizine (ZYRTEC) 10 MG tablet Take 10 mg by mouth daily.    Marland Kitchen diltiazem (CARDIZEM CD) 180 MG 24 hr capsule Take 180 mg by mouth daily.    Marland Kitchen docusate sodium (COLACE) 100 MG capsule Take 100 mg by mouth daily.    Marland Kitchen estradiol (VIVELLE-DOT) 0.1 MG/24HR patch Place 1 patch (0.1 mg total) onto the skin 2 (two) times a week. 8 patch 12  . Ferrous Sulfate (IRON) 325 (65 Fe) MG TABS Take by mouth  every 3 (three) days.    . fluticasone (FLONASE) 50 MCG/ACT nasal spray 2 sprays by Each Nare route daily.    . metroNIDAZOLE (FLAGYL) 500 MG tablet Take 1 tablet (500 mg total) by mouth 3 (three) times daily. 30 tablet 2  . Multiple Vitamin (MULTIVITAMIN) tablet Take 1 tablet by mouth daily. Multivitamin with iron    . progesterone (PROMETRIUM) 200 MG capsule Take 1 capsule (200 mg total) by mouth daily. nightly 20 capsule 11  . acyclovir (ZOVIRAX) 400 MG tablet Take 400 mg by mouth 5 (five) times daily. (Patient not taking: Reported on 01/23/2020)    . ciprofloxacin (CIPRO) 500 MG  tablet Take 1 tablet (500 mg total) by mouth 2 (two) times daily. (Patient not taking: Reported on 01/23/2020) 20 tablet 2  . ondansetron (ZOFRAN) 4 MG tablet Take 1 tablet (4 mg total) by mouth every 6 (six) hours as needed for nausea or vomiting. (Patient not taking: Reported on 01/23/2020) 20 tablet 0  . rizatriptan (MAXALT) 10 MG tablet Take 10 mg by mouth daily as needed. (Patient not taking: Reported on 01/23/2020)     No current facility-administered medications for this visit.    REVIEW OF SYSTEMS:   Constitutional: Denies fevers, chills or abnormal night sweats Respiratory: Denies dyspnea or wheezes, +cough and SOB Cardiovascular: Denies palpitation, chest discomfort or lower extremity swelling Gastrointestinal:  Denies nausea, heartburn or change in bowel habits Skin: Denies abnormal skin rashes Lymphatics: Denies new lymphadenopathy or easy bruising Neurological:Denies numbness, tingling or new weaknesses Behavioral/Psych: Mood is stable, no new changes  All other systems were reviewed with the patient and are negative.  PHYSICAL EXAMINATION: ECOG PERFORMANCE STATUS: 1 - Symptomatic but completely ambulatory  Vitals:   01/23/20 1340  BP: 127/71  Pulse: (!) 59  Resp: 16  SpO2: 98%   Filed Weights   01/23/20 1340  Weight: 190 lb 0.6 oz (86.2 kg)    GENERAL:alert, no distress and comfortable SKIN:  skin color, texture, turgor are normal, no rashes or significant lesions NECK: supple, thyroid normal size, non-tender, without nodularity LYMPH:  no palpable lymphadenopathy in the cervical, axillary or inguinal LUNGS: clear to auscultation and percussion with normal breathing effort HEART: regular rate & rhythm and no murmurs and no lower extremity edema ABDOMEN:abdomen soft, non-tender and normal bowel sounds Musculoskeletal:no cyanosis of digits and no clubbing  PSYCH: alert & oriented x 3 with fluent speech NEURO: no focal motor/sensory deficits  LABORATORY DATA:  I have reviewed the data as listed Recent Results (from the past 2160 hour(s))  CBC with Differential/Platelet     Status: Abnormal   Collection Time: 10/27/19  5:55 AM  Result Value Ref Range   WBC 10.9 (H) 4.0 - 10.5 K/uL   RBC 4.74 3.87 - 5.11 MIL/uL   Hemoglobin 13.7 12.0 - 15.0 g/dL   HCT 41.3 36 - 46 %   MCV 87.1 80.0 - 100.0 fL   MCH 28.9 26.0 - 34.0 pg   MCHC 33.2 30.0 - 36.0 g/dL   RDW 12.9 11.5 - 15.5 %   Platelets 282 150 - 400 K/uL   nRBC 0.0 0.0 - 0.2 %   Neutrophils Relative % 71 %   Neutro Abs 7.8 (H) 1.7 - 7.7 K/uL   Lymphocytes Relative 19 %   Lymphs Abs 2.1 0.7 - 4.0 K/uL   Monocytes Relative 7 %   Monocytes Absolute 0.7 0 - 1 K/uL   Eosinophils Relative 2 %   Eosinophils Absolute 0.2 0 - 0 K/uL   Basophils Relative 1 %   Basophils Absolute 0.1 0 - 0 K/uL   Immature Granulocytes 0 %   Abs Immature Granulocytes 0.03 0.00 - 0.07 K/uL    Comment: Performed at [facility], [address], [place], [place] [address]  Basic metabolic panel     Status: Abnormal   Collection Time: 10/27/19  5:55 AM  Result Value Ref Range   Sodium 138 135 - 145 mmol/L   Potassium 3.5 3.5 - 5.1 mmol/L   Chloride 105 98 - 111 mmol/L   CO2 24 22 - 32 mmol/L   Glucose, Bld 115 (H) 70 -  99 mg/dL    Comment: Glucose reference range applies only to samples taken after fasting for at least 8 hours.   BUN 12 6 - 20  mg/dL   Creatinine, Ser 0.89 0.44 - 1.00 mg/dL   Calcium 8.9 8.9 - 10.3 mg/dL   GFR calc non Af Amer >60 >60 mL/min   GFR calc Af Amer >60 >60 mL/min   Anion gap 9 5 - 15    Comment: Performed at [facility], [address], [place], [place] [address]  CBC     Status: None   Collection Time: 12/17/19  9:52 AM  Result Value Ref Range   WBC 5.7 4.0 - 10.5 K/uL   RBC 4.42 3.87 - 5.11 MIL/uL   Hemoglobin 12.6 12.0 - 15.0 g/dL   HCT 39.0 36 - 46 %   MCV 88.2 80.0 - 100.0 fL   MCH 28.5 26.0 - 34.0 pg   MCHC 32.3 30.0 - 36.0 g/dL   RDW 13.2 11.5 - 15.5 %   Platelets 265 150 - 400 K/uL   nRBC 0.0 0.0 - 0.2 %    Comment: Performed at [facility], [address], [place], [place] [address]  Comprehensive metabolic panel     Status: Abnormal   Collection Time: 12/17/19  9:52 AM  Result Value Ref Range   Sodium 139 135 - 145 mmol/L   Potassium 3.9 3.5 - 5.1 mmol/L   Chloride 107 98 - 111 mmol/L   CO2 24 22 - 32 mmol/L   Glucose, Bld 98 70 - 99 mg/dL    Comment: Glucose reference range applies only to samples taken after fasting for at least 8 hours.   BUN 15 6 - 20 mg/dL   Creatinine, Ser 0.83 0.44 - 1.00 mg/dL   Calcium 8.6 (L) 8.9 - 10.3 mg/dL   Total Protein 6.7 6.5 - 8.1 g/dL   Albumin 3.8 3.5 - 5.0 g/dL   AST 12 (L) 15 - 41 U/L   ALT 18 0 - 44 U/L   Alkaline Phosphatase 51 38 - 126 U/L   Total Bilirubin 0.7 0.3 - 1.2 mg/dL   GFR calc non Af Amer >60 >60 mL/min   GFR calc Af Amer >60 >60 mL/min   Anion gap 8 5 - 15    Comment: Performed at [facility], [address], [place], [place] [address]  Lipid panel     Status: Abnormal   Collection Time: 12/17/19  9:52 AM  Result Value Ref Range   Cholesterol 194 0 - 200 mg/dL   Triglycerides 137 <150 mg/dL   HDL 35 (L) >40 mg/dL   Total CHOL/HDL Ratio 5.5 RATIO   VLDL 27 0 - 40 mg/dL   LDL Cholesterol 132 (H) 0 - 99 mg/dL    Comment:        Total Cholesterol/HDL:CHD Risk Coronary Heart Disease Risk Table                      Men   Women  1/2 Average Risk   3.4   3.3  Average Risk       5.0   4.4  2 X Average Risk   9.6   7.1  3 X Average Risk  23.4   11.0        Use the calculated Patient Ratio above and the CHD Risk Table to determine the patient's CHD Risk.        ATP III CLASSIFICATION (LDL):  <  100     mg/dL   Optimal  100-129  mg/dL   Near or Above                    Optimal  130-159  mg/dL   Borderline  160-189  mg/dL   High  >190     mg/dL   Very High Performed at [facility], [address], [place], [place] [address]   Ethanol     Status: None   Collection Time: 12/17/19  9:52 AM  Result Value Ref Range   Alcohol, Ethyl (B) <10 <10 mg/dL    Comment: (NOTE) Lowest detectable limit for serum alcohol is 10 mg/dL.  For medical purposes only. Performed at [facility], [address] [place], [place] [address]   Ferritin (Iron Binding Protein)     Status: None   Collection Time: 12/17/19  9:52 AM  Result Value Ref Range   Ferritin 20 11 - 307 ng/mL    Comment: Performed at [facility], [address] [place], [place] [address]  Urinalysis, Routine w reflex microscopic     Status: Abnormal   Collection Time: 12/17/19  9:52 AM  Result Value Ref Range   Color, Urine YELLOW YELLOW   APPearance CLEAR CLEAR   Specific Gravity, Urine 1.024 1.005 - 1.030   pH 7.0 5.0 - 8.0   Glucose, UA NEGATIVE NEGATIVE mg/dL   Hgb urine dipstick SMALL (A) NEGATIVE   Bilirubin Urine NEGATIVE NEGATIVE   Ketones, ur NEGATIVE NEGATIVE mg/dL   Protein, ur 30 (A) NEGATIVE mg/dL   Nitrite NEGATIVE NEGATIVE   Leukocytes,Ua NEGATIVE NEGATIVE   RBC / HPF 6-10 0 - 5 RBC/hpf   WBC, UA 0-5 0 - 5 WBC/hpf   Bacteria, UA RARE (A) NONE SEEN   Squamous Epithelial / LPF 6-10 0 - 5   Mucus PRESENT     Comment: Performed at [facility], [address], [place], [place] [address]  Hemoglobin A1c     Status: Abnormal   Collection Time: 12/17/19  9:52 AM  Result Value Ref Range   Hgb A1c MFr Bld 5.8 (H) 4.8 - 5.6 %     Comment: (NOTE) Pre diabetes:          5.7%-6.4%  Diabetes:              >6.4%  Glycemic control for   <7.0% adults with diabetes    Mean Plasma Glucose 119.76 mg/dL    Comment: Performed at [facility] [address] [place], [place] [address]  Save Smear [facility])     Status: None   Collection Time: 01/23/20  3:05 PM  Result Value Ref Range   Smear Review SMEAR STAINED AND AVAILABLE FOR REVIEW     Comment: Performed at [facility], [address], [place], [place] [address]  Reticulocytes     Status: None   Collection Time: 01/23/20  3:05 PM  Result Value Ref Range   Retic Ct Pct 1.1 0.4 - 3.1 %   RBC. 4.53 3.87 - 5.11 MIL/uL   Retic Count, Absolute 51.6 19.0 - 186.0 K/uL   Immature Retic Fract 4.6 2.3 - 15.9 %    Comment: Performed at [facility], [address], [place], [place] [address]  Folate     Status: None   Collection Time: 01/23/20  3:05 PM  Result Value Ref Range   Folate 11.7 >5.9 ng/mL    Comment: Performed at [facility], [address], [place], [place] [address]  Vitamin B12     Status: None   Collection Time: 01/23/20  3:05 PM  Result Value Ref Range   Vitamin B-12 202 180 - 914 pg/mL    Comment: (NOTE) This assay is not validated for testing neonatal or myeloproliferative syndrome specimens for Vitamin B12 levels. Performed at [facility], [address] [place], [place] [address]   Lactate dehydrogenase     Status: None   Collection Time: 01/23/20  3:05 PM  Result Value Ref Range   LDH 124 98 - 192 U/L    Comment: Performed at [facility], [address], [place], [place] [address]  CBC with Differential/Platelet     Status: None   Collection Time: 01/23/20  3:05 PM  Result Value Ref Range   WBC 5.9 4.0 - 10.5 K/uL   RBC 4.52 3.87 - 5.11 MIL/uL   Hemoglobin 13.0 12.0 - 15.0 g/dL   HCT 40.3 36 - 46 %   MCV 89.2 80.0 - 100.0 fL   MCH 28.8 26.0 - 34.0 pg   MCHC 32.3 30.0 - 36.0 g/dL   RDW 13.3 11.5 - 15.5 %   Platelets 253 150 - 400 K/uL    nRBC 0.0 0.0 - 0.2 %   Neutrophils Relative % 56 %   Neutro Abs 3.3 1.7 - 7.7 K/uL   Lymphocytes Relative 32 %   Lymphs Abs 1.9 0.7 - 4.0 K/uL   Monocytes Relative 8 %   Monocytes Absolute 0.5 0 - 1 K/uL   Eosinophils Relative 3 %   Eosinophils Absolute 0.2 0 - 0 K/uL   Basophils Relative 1 %   Basophils Absolute 0.1 0 - 0 K/uL   Immature Granulocytes 0 %   Abs Immature Granulocytes 0.01 0.00 - 0.07 K/uL    Comment: Performed at [facility], [address], [place], [place] [address]  Comprehensive metabolic panel     Status: Abnormal   Collection Time: 01/23/20  3:05 PM  Result Value Ref Range   Sodium 137 135 - 145 mmol/L   Potassium 3.9 3.5 - 5.1 mmol/L   Chloride 103 98 - 111 mmol/L   CO2 26 22 - 32 mmol/L   Glucose, Bld 89 70 - 99 mg/dL    Comment: Glucose reference range applies only to samples taken after fasting for at least 8 hours.   BUN 14 6 - 20 mg/dL   Creatinine, Ser 0.76 0.44 - 1.00 mg/dL   Calcium 8.8 (L) 8.9 - 10.3 mg/dL   Total Protein 7.1 6.5 - 8.1 g/dL   Albumin 4.0 3.5 - 5.0 g/dL   AST 16 15 - 41 U/L   ALT 21 0 - 44 U/L   Alkaline Phosphatase 49 38 - 126 U/L   Total Bilirubin 0.7 0.3 - 1.2 mg/dL   GFR calc non Af Amer >60 >60 mL/min   GFR calc Af Amer >60 >60 mL/min   Anion gap 8 5 - 15    Comment: Performed at [facility], [address] [place], [place] [address]  Ferritin     Status: None   Collection Time: 01/23/20  3:05 PM  Result Value Ref Range   Ferritin 28 11 - 307 ng/mL    Comment: Performed at [facility], [address] [place], [place] [address]  Iron and TIBC     Status: Abnormal   Collection Time: 01/23/20  3:05 PM  Result Value Ref Range   Iron 139 28 -  170 ug/dL   TIBC 403 250 - 450 ug/dL   Saturation Ratios 34 (H) 10.4 - 31.8 %   UIBC 264 ug/dL    Comment: Performed at [facility], [address] [place], [place] [address]  Lactate dehydrogenase     Status: None   Collection Time: 01/23/20  3:05 PM  Result Value Ref Range    LDH 117 98 - 192 U/L    Comment: Performed at [facility], [address] [place], [place] [address]  Vitamin B12     Status: None   Collection Time: 01/23/20  3:05 PM  Result Value Ref Range   Vitamin B-12 181 180 - 914 pg/mL    Comment: (NOTE) This assay is not validated for testing neonatal or myeloproliferative syndrome specimens for Vitamin B12 levels. Performed at [facility], [address], [place], [place] [address]     RADIOGRAPHIC STUDIES: I have personally reviewed the radiological images as listed and agreed with the findings in the report. I have independently interviewed this patient and agree with HPI written by my nurse practitioner Wenda Low, FNP. I have independently formulated my assessment and plan. ASSESSMENT & PLAN:  1. Normocytic anemia and iron deficiency state: -CBC from Dr. Ria Comment office shows hemoglobin 11.7, MCV 83, ferritin of 16. -She has been taking iron tablet once every 2 to 3 days. She could not tolerate more than that due to constipation. -She has iron deficiency state from menstrual bleeding. -She has severe fatigue. She is not able to work more than 2 shifts back-to-back. 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 She also has a history of rectal bleeding 2015. She is planning to schedule colonoscopy with Dr. Laural Golden soon. -Reviewed recent labs which showed ferritin of 20 on 12/17/2019. -I have recommended checking T70, folic acid, methylmalonic acid and copper levels to rule out other coexisting nutritional deficiencies. -I have recommended Feraheme weekly x2. She has multiple drug allergies. Hence I recommended premedication with steroid. -Plan to see her back in 12 weeks for follow-up with repeat labs.   All questions were answered. The patient knows to call the clinic with any problems, questions or concerns.      Derek Jack, MD 01/23/20 5:33 PM

## 2020-01-24 LAB — PROTEIN ELECTROPHORESIS, SERUM
A/G Ratio: 1.4 (ref 0.7–1.7)
Albumin ELP: 3.7 g/dL (ref 2.9–4.4)
Alpha-1-Globulin: 0.2 g/dL (ref 0.0–0.4)
Alpha-2-Globulin: 0.7 g/dL (ref 0.4–1.0)
Beta Globulin: 1 g/dL (ref 0.7–1.3)
Gamma Globulin: 0.8 g/dL (ref 0.4–1.8)
Globulin, Total: 2.7 g/dL (ref 2.2–3.9)
Total Protein ELP: 6.4 g/dL (ref 6.0–8.5)

## 2020-01-24 LAB — HAPTOGLOBIN: Haptoglobin: 135 mg/dL (ref 33–346)

## 2020-01-25 LAB — METHYLMALONIC ACID, SERUM: Methylmalonic Acid, Quantitative: 134 nmol/L (ref 0–378)

## 2020-01-30 ENCOUNTER — Inpatient Hospital Stay (HOSPITAL_COMMUNITY): Payer: 59

## 2020-01-30 ENCOUNTER — Other Ambulatory Visit: Payer: Self-pay

## 2020-01-30 ENCOUNTER — Encounter (HOSPITAL_COMMUNITY): Payer: Self-pay

## 2020-01-30 VITALS — BP 136/59 | HR 58 | Temp 97.5°F | Resp 18

## 2020-01-30 DIAGNOSIS — D500 Iron deficiency anemia secondary to blood loss (chronic): Secondary | ICD-10-CM | POA: Diagnosis not present

## 2020-01-30 DIAGNOSIS — Z8249 Family history of ischemic heart disease and other diseases of the circulatory system: Secondary | ICD-10-CM | POA: Diagnosis not present

## 2020-01-30 DIAGNOSIS — Z800 Family history of malignant neoplasm of digestive organs: Secondary | ICD-10-CM | POA: Diagnosis not present

## 2020-01-30 DIAGNOSIS — Z87891 Personal history of nicotine dependence: Secondary | ICD-10-CM | POA: Diagnosis not present

## 2020-01-30 DIAGNOSIS — D509 Iron deficiency anemia, unspecified: Secondary | ICD-10-CM

## 2020-01-30 DIAGNOSIS — Z8042 Family history of malignant neoplasm of prostate: Secondary | ICD-10-CM | POA: Diagnosis not present

## 2020-01-30 DIAGNOSIS — N920 Excessive and frequent menstruation with regular cycle: Secondary | ICD-10-CM | POA: Diagnosis not present

## 2020-01-30 LAB — COPPER, SERUM: Copper: 118 ug/dL (ref 80–158)

## 2020-01-30 MED ORDER — LORATADINE 10 MG PO TABS
ORAL_TABLET | ORAL | Status: AC
  Filled 2020-01-30: qty 1

## 2020-01-30 MED ORDER — FAMOTIDINE 20 MG PO TABS
ORAL_TABLET | ORAL | Status: AC
  Filled 2020-01-30: qty 1

## 2020-01-30 MED ORDER — LORATADINE 10 MG PO TABS
10.0000 mg | ORAL_TABLET | Freq: Once | ORAL | Status: AC
  Administered 2020-01-30: 10 mg via ORAL

## 2020-01-30 MED ORDER — FAMOTIDINE 20 MG PO TABS
20.0000 mg | ORAL_TABLET | Freq: Once | ORAL | Status: AC
  Administered 2020-01-30: 20 mg via ORAL

## 2020-01-30 MED ORDER — ACETAMINOPHEN 325 MG PO TABS
ORAL_TABLET | ORAL | Status: AC
  Filled 2020-01-30: qty 2

## 2020-01-30 MED ORDER — SODIUM CHLORIDE 0.9 % IV SOLN: Freq: Once | INTRAVENOUS | Status: AC

## 2020-01-30 MED ORDER — ACETAMINOPHEN 325 MG PO TABS
650.0000 mg | ORAL_TABLET | Freq: Once | ORAL | Status: AC
  Administered 2020-01-30: 650 mg via ORAL

## 2020-01-30 MED ORDER — SODIUM CHLORIDE 0.9 % IV SOLN
200.0000 mg | Freq: Once | INTRAVENOUS | Status: AC
  Administered 2020-01-30: 200 mg via INTRAVENOUS
  Filled 2020-01-30: qty 200

## 2020-01-30 NOTE — Progress Notes (Signed)
Premedications added for Venofer as requested by Dr Delton Coombes:  Tylenol 650 mg po x 1 Famotidine 20 mg po x 1 Claritin 10 mg po x 1  Supportive plan updated as above.  T.O. Dr Rhys Martini, PharmD

## 2020-01-30 NOTE — Progress Notes (Signed)
Pt tolerated iron infusion well today without incidence.  Vital signs stable prior to discharge.  Discharged ambulatory.

## 2020-01-30 NOTE — Patient Instructions (Signed)
[facility]  Discharge Instructions:   _______________________________________________________________  Thank you for choosing [facility] to provide your oncology and hematology care.  To afford each patient quality time with our providers, please arrive at least 15 minutes before your scheduled appointment.  You need to re-schedule your appointment if you arrive 10 or more minutes late.  We strive to give you quality time with our providers, and arriving late affects you and other patients whose appointments are after yours.  Also, if you no show three or more times for appointments you may be dismissed from the clinic.  Again, thank you for choosing [facility] at [facility]. Our hope is that these requests will allow you access to exceptional care and in a timely manner. _______________________________________________________________  If you have questions after your visit, please contact our office at [phone] between the hours of 8:30 a.m. and 5:00 p.m. Voicemails left after 4:30 p.m. will not be returned until the following business day. _______________________________________________________________  For prescription refill requests, have your pharmacy contact our office. _______________________________________________________________  Recommendations made by the consultant and any test results will be sent to your referring physician. _______________________________________________________________ 

## 2020-02-03 ENCOUNTER — Other Ambulatory Visit: Payer: Self-pay

## 2020-02-03 ENCOUNTER — Inpatient Hospital Stay (HOSPITAL_COMMUNITY): Payer: 59

## 2020-02-03 ENCOUNTER — Encounter (HOSPITAL_COMMUNITY): Payer: Self-pay

## 2020-02-03 VITALS — BP 142/77 | HR 69 | Temp 97.2°F | Resp 18

## 2020-02-03 DIAGNOSIS — Z87891 Personal history of nicotine dependence: Secondary | ICD-10-CM | POA: Diagnosis not present

## 2020-02-03 DIAGNOSIS — Z8042 Family history of malignant neoplasm of prostate: Secondary | ICD-10-CM | POA: Diagnosis not present

## 2020-02-03 DIAGNOSIS — N920 Excessive and frequent menstruation with regular cycle: Secondary | ICD-10-CM | POA: Diagnosis not present

## 2020-02-03 DIAGNOSIS — D509 Iron deficiency anemia, unspecified: Secondary | ICD-10-CM

## 2020-02-03 DIAGNOSIS — D500 Iron deficiency anemia secondary to blood loss (chronic): Secondary | ICD-10-CM | POA: Diagnosis not present

## 2020-02-03 DIAGNOSIS — Z800 Family history of malignant neoplasm of digestive organs: Secondary | ICD-10-CM | POA: Diagnosis not present

## 2020-02-03 DIAGNOSIS — Z8249 Family history of ischemic heart disease and other diseases of the circulatory system: Secondary | ICD-10-CM | POA: Diagnosis not present

## 2020-02-03 MED ORDER — SODIUM CHLORIDE 0.9 % IV SOLN
200.0000 mg | Freq: Once | INTRAVENOUS | Status: AC
  Administered 2020-02-03: 200 mg via INTRAVENOUS
  Filled 2020-02-03: qty 200

## 2020-02-03 MED ORDER — SODIUM CHLORIDE 0.9 % IV SOLN: Freq: Once | INTRAVENOUS | Status: AC

## 2020-02-03 MED ORDER — FAMOTIDINE 20 MG PO TABS
20.0000 mg | ORAL_TABLET | Freq: Once | ORAL | Status: AC
  Administered 2020-02-03: 20 mg via ORAL
  Filled 2020-02-03: qty 1

## 2020-02-03 MED ORDER — LORATADINE 10 MG PO TABS
10.0000 mg | ORAL_TABLET | Freq: Once | ORAL | Status: AC
  Administered 2020-02-03: 10 mg via ORAL
  Filled 2020-02-03: qty 1

## 2020-02-03 MED ORDER — ACETAMINOPHEN 325 MG PO TABS
650.0000 mg | ORAL_TABLET | Freq: Once | ORAL | Status: AC
  Administered 2020-02-03: 650 mg via ORAL
  Filled 2020-02-03: qty 2

## 2020-02-03 NOTE — Progress Notes (Signed)
Patient tolerated iron infusion with no complaints voiced.  Peripheral IV site clean and dry with good blood return noted before and after infusion.  Band aid applied.  VSS with discharge and left in satisfactory condition with no s/s of distress noted.   

## 2020-02-06 ENCOUNTER — Telehealth: Payer: 59 | Admitting: Obstetrics & Gynecology

## 2020-02-07 ENCOUNTER — Other Ambulatory Visit: Payer: Self-pay

## 2020-02-07 ENCOUNTER — Inpatient Hospital Stay (HOSPITAL_COMMUNITY): Payer: 59

## 2020-02-07 VITALS — BP 152/75 | HR 70 | Temp 97.1°F | Resp 18

## 2020-02-07 DIAGNOSIS — N920 Excessive and frequent menstruation with regular cycle: Secondary | ICD-10-CM | POA: Diagnosis not present

## 2020-02-07 DIAGNOSIS — Z8042 Family history of malignant neoplasm of prostate: Secondary | ICD-10-CM | POA: Diagnosis not present

## 2020-02-07 DIAGNOSIS — D509 Iron deficiency anemia, unspecified: Secondary | ICD-10-CM

## 2020-02-07 DIAGNOSIS — Z800 Family history of malignant neoplasm of digestive organs: Secondary | ICD-10-CM | POA: Diagnosis not present

## 2020-02-07 DIAGNOSIS — D500 Iron deficiency anemia secondary to blood loss (chronic): Secondary | ICD-10-CM | POA: Diagnosis not present

## 2020-02-07 DIAGNOSIS — Z8249 Family history of ischemic heart disease and other diseases of the circulatory system: Secondary | ICD-10-CM | POA: Diagnosis not present

## 2020-02-07 DIAGNOSIS — Z87891 Personal history of nicotine dependence: Secondary | ICD-10-CM | POA: Diagnosis not present

## 2020-02-07 MED ORDER — ACETAMINOPHEN 325 MG PO TABS
ORAL_TABLET | ORAL | Status: AC
  Filled 2020-02-07: qty 2

## 2020-02-07 MED ORDER — SODIUM CHLORIDE 0.9 % IV SOLN: Freq: Once | INTRAVENOUS | Status: AC

## 2020-02-07 MED ORDER — SODIUM CHLORIDE 0.9 % IV SOLN
200.0000 mg | Freq: Once | INTRAVENOUS | Status: AC
  Administered 2020-02-07: 200 mg via INTRAVENOUS
  Filled 2020-02-07: qty 200

## 2020-02-07 MED ORDER — LORATADINE 10 MG PO TABS
10.0000 mg | ORAL_TABLET | Freq: Once | ORAL | Status: AC
  Administered 2020-02-07: 10 mg via ORAL

## 2020-02-07 MED ORDER — LORATADINE 10 MG PO TABS
ORAL_TABLET | ORAL | Status: AC
  Filled 2020-02-07: qty 1

## 2020-02-07 MED ORDER — FAMOTIDINE 20 MG PO TABS
20.0000 mg | ORAL_TABLET | Freq: Once | ORAL | Status: AC
  Administered 2020-02-07: 20 mg via ORAL

## 2020-02-07 MED ORDER — FAMOTIDINE 20 MG PO TABS
ORAL_TABLET | ORAL | Status: AC
  Filled 2020-02-07: qty 1

## 2020-02-07 MED ORDER — ACETAMINOPHEN 325 MG PO TABS
650.0000 mg | ORAL_TABLET | Freq: Once | ORAL | Status: AC
  Administered 2020-02-07: 650 mg via ORAL

## 2020-02-07 NOTE — Progress Notes (Signed)
Patient presents today for iron infusion per MD orders. She reports generalized fatigue but overall feels okay.    Venofer given per orders, see MAR for administration information.  Patient discharged ambulatory and in stable condition from clinic.  She will follow up Monday as scheduled.

## 2020-02-07 NOTE — Patient Instructions (Signed)
You had your iron infusion today.  You will follow up as scheduled.

## 2020-02-10 ENCOUNTER — Other Ambulatory Visit: Payer: Self-pay

## 2020-02-10 ENCOUNTER — Inpatient Hospital Stay (HOSPITAL_COMMUNITY): Payer: 59

## 2020-02-10 VITALS — BP 136/69 | HR 71 | Temp 97.3°F | Resp 17

## 2020-02-10 DIAGNOSIS — Z8042 Family history of malignant neoplasm of prostate: Secondary | ICD-10-CM | POA: Diagnosis not present

## 2020-02-10 DIAGNOSIS — Z800 Family history of malignant neoplasm of digestive organs: Secondary | ICD-10-CM | POA: Diagnosis not present

## 2020-02-10 DIAGNOSIS — Z8249 Family history of ischemic heart disease and other diseases of the circulatory system: Secondary | ICD-10-CM | POA: Diagnosis not present

## 2020-02-10 DIAGNOSIS — D500 Iron deficiency anemia secondary to blood loss (chronic): Secondary | ICD-10-CM | POA: Diagnosis not present

## 2020-02-10 DIAGNOSIS — D509 Iron deficiency anemia, unspecified: Secondary | ICD-10-CM

## 2020-02-10 DIAGNOSIS — N920 Excessive and frequent menstruation with regular cycle: Secondary | ICD-10-CM | POA: Diagnosis not present

## 2020-02-10 DIAGNOSIS — Z87891 Personal history of nicotine dependence: Secondary | ICD-10-CM | POA: Diagnosis not present

## 2020-02-10 MED ORDER — SODIUM CHLORIDE 0.9 % IV SOLN: Freq: Once | INTRAVENOUS | Status: AC

## 2020-02-10 MED ORDER — SODIUM CHLORIDE 0.9 % IV SOLN
200.0000 mg | Freq: Once | INTRAVENOUS | Status: AC
  Administered 2020-02-10: 200 mg via INTRAVENOUS
  Filled 2020-02-10: qty 200

## 2020-02-10 MED ORDER — LORATADINE 10 MG PO TABS
10.0000 mg | ORAL_TABLET | Freq: Once | ORAL | Status: AC
  Administered 2020-02-10: 10 mg via ORAL
  Filled 2020-02-10: qty 1

## 2020-02-10 MED ORDER — FAMOTIDINE 20 MG PO TABS
20.0000 mg | ORAL_TABLET | Freq: Once | ORAL | Status: AC
  Administered 2020-02-10: 20 mg via ORAL
  Filled 2020-02-10: qty 1

## 2020-02-10 MED ORDER — ACETAMINOPHEN 325 MG PO TABS
650.0000 mg | ORAL_TABLET | Freq: Once | ORAL | Status: AC
  Administered 2020-02-10: 650 mg via ORAL
  Filled 2020-02-10: qty 2

## 2020-02-10 NOTE — Patient Instructions (Signed)
[place] at [facility]  Discharge Instructions:  IV Venofer received today.   Iron Sucrose injection What is this medicine? IRON SUCROSE (AHY ern SOO krohs) is an iron complex. Iron is used to make healthy red blood cells, which carry oxygen and nutrients throughout the body. This medicine is used to treat iron deficiency anemia in people with chronic kidney disease. This medicine may be used for other purposes; ask your health care provider or pharmacist if you have questions. COMMON BRAND NAME(S): Venofer What should I tell my health care provider before I take this medicine? They need to know if you have any of these conditions:  anemia not caused by low iron levels  heart disease  high levels of iron in the blood  kidney disease  liver disease  an unusual or allergic reaction to iron, other medicines, foods, dyes, or preservatives  pregnant or trying to get pregnant  breast-feeding How should I use this medicine? This medicine is for infusion into a vein. It is given by a health care professional in a hospital or clinic setting. Talk to your pediatrician regarding the use of this medicine in children. While this drug may be prescribed for children as young as 2 years for selected conditions, precautions do apply. Overdosage: If you think you have taken too much of this medicine contact a poison control center or emergency room at once. NOTE: This medicine is only for you. Do not share this medicine with others. What if I miss a dose? It is important not to miss your dose. Call your doctor or health care professional if you are unable to keep an appointment. What may interact with this medicine? Do not take this medicine with any of the following medications:  deferoxamine  dimercaprol  other iron products This medicine may also interact with the following medications:  chloramphenicol  deferasirox This list may not describe all possible  interactions. Give your health care provider a list of all the medicines, herbs, non-prescription drugs, or dietary supplements you use. Also tell them if you smoke, drink alcohol, or use illegal drugs. Some items may interact with your medicine. What should I watch for while using this medicine? Visit your doctor or healthcare professional regularly. Tell your doctor or healthcare professional if your symptoms do not start to get better or if they get worse. You may need blood work done while you are taking this medicine. You may need to follow a special diet. Talk to your doctor. Foods that contain iron include: whole grains/cereals, dried fruits, beans, or peas, leafy green vegetables, and organ meats (liver, kidney). What side effects may I notice from receiving this medicine? Side effects that you should report to your doctor or health care professional as soon as possible:  allergic reactions like skin rash, itching or hives, swelling of the face, lips, or tongue  breathing problems  changes in blood pressure  cough  fast, irregular heartbeat  feeling faint or lightheaded, falls  fever or chills  flushing, sweating, or hot feelings  joint or muscle aches/pains  seizures  swelling of the ankles or feet  unusually weak or tired Side effects that usually do not require medical attention (report to your doctor or health care professional if they continue or are bothersome):  diarrhea  feeling achy  headache  irritation at site where injected  nausea, vomiting  stomach upset  tiredness This list may not describe all possible side effects. Call your doctor for  medical advice about side effects. You may report side effects to FDA at 1-800-FDA-1088. Where should I keep my medicine? This drug is given in a hospital or clinic and will not be stored at home. NOTE: This sheet is a summary. It may not cover all possible information. If you have questions about this medicine,  talk to your doctor, pharmacist, or health care provider.  2020 Elsevier/Gold Standard (2011-02-10 17:14:35)  _______________________________________________________________  Thank you for choosing [place] at [facility] to provide your oncology and hematology care.  To afford each patient quality time with our providers, please arrive at least 15 minutes before your scheduled appointment.  You need to re-schedule your appointment if you arrive 10 or more minutes late.  We strive to give you quality time with our providers, and arriving late affects you and other patients whose appointments are after yours.  Also, if you no show three or more times for appointments you may be dismissed from the clinic.  Again, thank you for choosing [place] at [place] hope is that these requests will allow you access to exceptional care and in a timely manner. _______________________________________________________________  If you have questions after your visit, please contact our office at [phone] between the hours of 8:30 a.m. and 5:00 p.m. Voicemails left after 4:30 p.m. will not be returned until the following business day. _______________________________________________________________  For prescription refill requests, have your pharmacy contact our office. _______________________________________________________________  Recommendations made by the consultant and any test results will be sent to your referring physician. _______________________________________________________________

## 2020-02-10 NOTE — Progress Notes (Signed)
Gina Whitaker presents today for IV Venofer infusion, dose 4 of 5. Premedicated per treatment plan. Infusion tolerated without incident or complaint. See MAR for details. VSS prior to and post infusion. Discharged in satisfactory condition with follow up instructions.

## 2020-02-11 ENCOUNTER — Telehealth (INDEPENDENT_AMBULATORY_CARE_PROVIDER_SITE_OTHER): Payer: 59 | Admitting: Obstetrics & Gynecology

## 2020-02-11 ENCOUNTER — Other Ambulatory Visit: Payer: Self-pay | Admitting: Obstetrics & Gynecology

## 2020-02-11 DIAGNOSIS — N951 Menopausal and female climacteric states: Secondary | ICD-10-CM

## 2020-02-11 MED ORDER — PROGESTERONE 200 MG PO CAPS
200.0000 mg | ORAL_CAPSULE | Freq: Every day | ORAL | 3 refills | Status: DC
Start: 2020-02-11 — End: 2020-02-11

## 2020-02-11 MED ORDER — ESTRADIOL 0.1 MG/24HR TD PTTW
1.0000 | MEDICATED_PATCH | TRANSDERMAL | 3 refills | Status: DC
Start: 2020-02-13 — End: 2020-02-13

## 2020-02-11 MED FILL — PROGESTERONE MICRONIZED 200: 200 | 90 days supply | Qty: 90 | Fill #0

## 2020-02-11 MED FILL — ESTRADIOL 0.1 MG PATCH: 0.1 | 84 days supply | Qty: 24 | Fill #0

## 2020-02-11 NOTE — Progress Notes (Signed)
   TELEHEALTH VIRTUAL GYNECOLOGY VISIT ENCOUNTER NOTE  I connected with Gina Whitaker on 02/11/20 at 10:10 AM EDT by Mychart video in car and verified that I am speaking with the correct person using two identifiers.   I discussed the limitations, risks, security and privacy concerns of performing an evaluation and management service by telephone and the availability of in person appointments. I also discussed with the patient that there may be a patient responsible charge related to this service. The patient expressed understanding and agreed to proceed.   History:  Gina Whitaker is a 54 y.o. [phone] female being evaluated today for response to HRT. She denies any abnormal vaginal discharge, bleeding, pelvic pain or other concerns.    Her sleeping is improved, her hot flushes have resolved, no night sweats Libido is still sub optimal although sexual function is appropriate     Past Medical History:  Diagnosis Date  . Asthma   . Diverticulitis   . Hypertension   . Sleep apnea    Past Surgical History:  Procedure Laterality Date  . APPENDECTOMY    . CHOLECYSTECTOMY    . endometrial ablation    . NECK SURGERY  2017   t lift and fixation  C4 C5 C6 C7   . TEMPOROMANDIBULAR JOINT SURGERY     twice bilateral   The following portions of the patient's history were reviewed and updated as appropriate: allergies, current medications, past family history, past medical history, past social history, past surgical history and problem list.   Health Maintenance:  Normal pap and negative HRHPV on 12/20.  Normal mammogram on .   Review of Systems:  Pertinent items noted in HPI and remainder of comprehensive ROS otherwise negative.  Physical Exam:  Physical exam deferred due to nature of the encounter  Labs and Imaging No results found for this or any previous visit (from the past 336 hour(s)). No results found.     Meds ordered this encounter  Medications  . progesterone  (PROMETRIUM) 200 MG capsule    Sig: Take 1 capsule (200 mg total) by mouth daily. nightly    Dispense:  90 capsule    Refill:  3  . estradiol (VIVELLE-DOT) 0.1 MG/24HR patch    Sig: Place 1 patch (0.1 mg total) onto the skin 2 (two) times a week.    Dispense:  24 patch    Refill:  3    No orders of the defined types were placed in this encounter.   Assessment and Plan:       ICD-10-CM   1. Perimenopausal symptoms  N95.1    improved/resolved on vivelle dot and prometrium         I discussed the assessment and treatment plan with the patient. The patient was provided an opportunity to ask questions and all were answered. The patient agreed with the plan and demonstrated an understanding of the instructions.   The patient was advised to call back or seek an in-person evaluation/go to the ED if the symptoms worsen or if the condition fails to improve as anticipated.  I provided 11 minutes of non-face-to-face time during this encounter.   Florian Buff, [place] for [facility]

## 2020-02-14 ENCOUNTER — Inpatient Hospital Stay (HOSPITAL_COMMUNITY): Payer: 59 | Attending: Hematology

## 2020-02-14 ENCOUNTER — Other Ambulatory Visit: Payer: Self-pay

## 2020-02-14 VITALS — BP 142/75 | HR 80 | Temp 96.9°F | Resp 18

## 2020-02-14 DIAGNOSIS — Z79899 Other long term (current) drug therapy: Secondary | ICD-10-CM | POA: Diagnosis not present

## 2020-02-14 DIAGNOSIS — D500 Iron deficiency anemia secondary to blood loss (chronic): Secondary | ICD-10-CM | POA: Insufficient documentation

## 2020-02-14 DIAGNOSIS — D509 Iron deficiency anemia, unspecified: Secondary | ICD-10-CM | POA: Diagnosis not present

## 2020-02-14 DIAGNOSIS — N920 Excessive and frequent menstruation with regular cycle: Secondary | ICD-10-CM | POA: Insufficient documentation

## 2020-02-14 MED ORDER — LORATADINE 10 MG PO TABS
ORAL_TABLET | ORAL | Status: AC
  Filled 2020-02-14: qty 1

## 2020-02-14 MED ORDER — ACETAMINOPHEN 325 MG PO TABS
ORAL_TABLET | ORAL | Status: AC
  Filled 2020-02-14: qty 2

## 2020-02-14 MED ORDER — SODIUM CHLORIDE 0.9 % IV SOLN: Freq: Once | INTRAVENOUS | Status: AC

## 2020-02-14 MED ORDER — FAMOTIDINE 20 MG PO TABS
ORAL_TABLET | ORAL | Status: AC
  Filled 2020-02-14: qty 1

## 2020-02-14 MED ORDER — LORATADINE 10 MG PO TABS
10.0000 mg | ORAL_TABLET | Freq: Once | ORAL | Status: AC
  Administered 2020-02-14: 10 mg via ORAL

## 2020-02-14 MED ORDER — ACETAMINOPHEN 325 MG PO TABS
650.0000 mg | ORAL_TABLET | Freq: Once | ORAL | Status: AC
  Administered 2020-02-14: 650 mg via ORAL

## 2020-02-14 MED ORDER — SODIUM CHLORIDE 0.9 % IV SOLN
200.0000 mg | Freq: Once | INTRAVENOUS | Status: AC
  Administered 2020-02-14: 200 mg via INTRAVENOUS
  Filled 2020-02-14: qty 10

## 2020-02-14 MED ORDER — FAMOTIDINE 20 MG PO TABS
20.0000 mg | ORAL_TABLET | Freq: Once | ORAL | Status: AC
  Administered 2020-02-14: 20 mg via ORAL
  Filled 2020-02-14: qty 1

## 2020-02-14 NOTE — Progress Notes (Signed)
Iron infusion given per orders. Patient tolerated it well without problems. Vitals stable and discharged home from clinic ambulatory in stable condition. Follow up as scheduled.  

## 2020-02-27 ENCOUNTER — Encounter (INDEPENDENT_AMBULATORY_CARE_PROVIDER_SITE_OTHER): Payer: Self-pay | Admitting: Gastroenterology

## 2020-02-27 ENCOUNTER — Telehealth (INDEPENDENT_AMBULATORY_CARE_PROVIDER_SITE_OTHER): Payer: Self-pay | Admitting: *Deleted

## 2020-02-27 ENCOUNTER — Other Ambulatory Visit (INDEPENDENT_AMBULATORY_CARE_PROVIDER_SITE_OTHER): Payer: Self-pay | Admitting: Gastroenterology

## 2020-02-27 ENCOUNTER — Other Ambulatory Visit: Payer: Self-pay

## 2020-02-27 ENCOUNTER — Ambulatory Visit (INDEPENDENT_AMBULATORY_CARE_PROVIDER_SITE_OTHER): Payer: 59 | Admitting: Gastroenterology

## 2020-02-27 ENCOUNTER — Encounter (INDEPENDENT_AMBULATORY_CARE_PROVIDER_SITE_OTHER): Payer: Self-pay | Admitting: *Deleted

## 2020-02-27 VITALS — BP 129/82 | HR 78 | Temp 97.0°F | Ht 65.0 in | Wt 190.4 lb

## 2020-02-27 DIAGNOSIS — D509 Iron deficiency anemia, unspecified: Secondary | ICD-10-CM | POA: Diagnosis not present

## 2020-02-27 DIAGNOSIS — K5732 Diverticulitis of large intestine without perforation or abscess without bleeding: Secondary | ICD-10-CM | POA: Diagnosis not present

## 2020-02-27 MED ORDER — SUPREP BOWEL PREP KIT 17.5-3.13-1.6 GM/177ML PO SOLN: 1.0000 | Freq: Once | ORAL | 0 refills | Status: DC

## 2020-02-27 MED FILL — SUPREP BOWEL PREP KIT: 17.5-3.13-1 | 1 days supply | Qty: 354 | Fill #0

## 2020-02-27 NOTE — Progress Notes (Signed)
Patient profile: Gina Whitaker is a 54 y.o. female seen for evaluation of diverticulitis. Referred by Dr. Willey Blade  History of Present Illness: Gina Whitaker is seen today for evaluation of diverticulitis. In summary February 2021 she developed mid abdominal pain that initially she thought was UTI and treated with Azo, it worsened and moved to the left lower quadrant and developed nausea with one episode of a bloody stool. She was seen & CT confirmed diverticulitis and treated with Cipro Flagyl. 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  She felt well after June 2021 until 3 days ago when developed an additional episode of pain this time in her right lower quadrant, PCP prescribed Cipro Flagyl and she is feeling so better. No fever or vomiting with the current episode.  Between episodes she typically has 2 bowel movements a day. She generally avoids gluten as her son is sensitive to gluten. She does take Colace once a day to prevent constipation.  Had a history of GERD and has been on Protonix and Zantac in the past but resolved with avoiding peanuts and bread as well as losing significant weight (intentionally). No current GERD symptoms, nausea, vomiting, dysphagia. Reports a history of SIBO after surgery in 2018.   She has a history of anemia and is followed by hematology. Reports hemoglobin was down to 8 in February 2020 after large-volume menstrual bleeding. She has had IV iron replacement. Has had difficulty tolerating oral iron due to nausea and constipation - currently takes PO iron every 3 days. She has repeat labs with hematology scheduled for December 2021.    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 Done by Dr Britta Mccreedy   Past Medical History:  Past Medical History:  Diagnosis Date  . Asthma   . Diverticulitis   . Hypertension   . Sleep apnea     Problem List: Patient Active Problem List   Diagnosis Date Noted  . Iron deficiency anemia 01/23/2020  . Hypertension 01/23/2020  . Sleep apnea 01/23/2020    Past Surgical History: Past Surgical History:  Procedure Laterality Date  . APPENDECTOMY    . CHOLECYSTECTOMY    . endometrial ablation    . NECK SURGERY  2017   t lift and fixation  C4 C5 C6 C7   . TEMPOROMANDIBULAR JOINT SURGERY     twice bilateral    Allergies: Allergies  Allergen Reactions  . Hydrocodone Rash and Swelling  . Tramadol     Rash and facial swelling  . Codeine     Rash and Itching  . Erythromycin   . Morphine   . Penicillins Rash      Home Medications:  Current Outpatient Medications:  .  acyclovir (ZOVIRAX) 400 MG tablet, Take 400 mg by mouth 5 (five) times daily. , Disp: , Rfl:  .  albuterol (VENTOLIN HFA) 108 (90 Base) MCG/ACT inhaler, Inhale into the lungs., Disp: , Rfl:  .  Calcium Carbonate-Vitamin D (CALTRATE 600+D PO), Take by mouth daily., Disp: , Rfl:  .  cetirizine (ZYRTEC) 10 MG tablet, Take 10 mg by mouth daily., Disp: , Rfl:  .  ciprofloxacin (CIPRO) 500 MG tablet, Take 1 tablet (500 mg total) by mouth 2 (two) times daily., Disp: 20 tablet, Rfl: 2 .  diltiazem (CARDIZEM CD) 180 MG 24 hr capsule, Take 180 mg by mouth daily., Disp: , Rfl:  .  docusate sodium (COLACE) 100 MG capsule, Take 100 mg by mouth daily., Disp: , Rfl:  .  estradiol (VIVELLE-DOT) 0.1 MG/24HR patch, Place 1 patch (0.1 mg total) onto the skin 2 (two) times a week., Disp: 24 patch, Rfl: 3 .  fluticasone (FLONASE) 50 MCG/ACT nasal spray, 2 sprays by Each Nare route daily., Disp: , Rfl:  .  metroNIDAZOLE (FLAGYL) 500 MG tablet, Take 1 tablet  (500 mg total) by mouth 3 (three) times daily., Disp: 30 tablet, Rfl: 2 .  Multiple Vitamin (MULTIVITAMIN) tablet, Take 1 tablet by mouth daily. Multivitamin with iron, Disp: , Rfl:  .  ondansetron (ZOFRAN) 4 MG tablet, Take 1 tablet (4 mg total) by mouth every 6 (six) hours as needed for nausea or vomiting., Disp: 20 tablet, Rfl: 0 .  progesterone (PROMETRIUM) 200 MG capsule, Take 1 capsule (200 mg total) by mouth daily. nightly, Disp: 90 capsule, Rfl: 3 .  rizatriptan (MAXALT) 10 MG tablet, Take 10 mg by mouth daily as needed. , Disp: , Rfl:  .  Ferrous Sulfate (IRON) 325 (65 Fe) MG TABS, Take by mouth every 3 (three) days. (Patient not taking: Reported on 02/07/2020), Disp: , Rfl:    Family History: family history includes Aneurysm in her maternal grandmother; Arthritis in her mother; Asthma in her daughter; Basal cell carcinoma in her father and paternal grandfather; CAD in her maternal grandmother; Cancer in her father and paternal grandmother; Congestive Heart Failure in her paternal grandfather; Dementia in her paternal grandfather; Glaucoma in her father; Heart attack in her maternal grandfather and paternal grandfather; High Cholesterol in her brother and mother; Hypertension in her brother, father, maternal grandmother, and mother; Iron deficiency in her mother; Irritable bowel syndrome in her mother; Melanoma in her mother; Mental illness in her maternal grandfather; Other in her brother; Polycystic ovary syndrome in her daughter.    Social History:   reports that she quit smoking about 27 years ago. Her smoking use included cigarettes. She has never used smokeless tobacco. She reports that she does not drink alcohol and does not use drugs.   Review of Systems: Constitutional: Denies weight loss/weight gain  Eyes: No changes in vision. ENT: No oral lesions, sore throat.  GI: see HPI.  Heme/Lymph: No easy bruising.  CV: No chest pain.  GU: No hematuria.  Integumentary: No rashes.    Neuro: No headaches.  Psych: No depression/anxiety.  Endocrine: No heat/cold intolerance.  Allergic/Immunologic: No urticaria.  Resp: No cough, SOB.  Musculoskeletal: No joint swelling.    Physical Examination: BP 129/82 (BP Location: Right Arm, Patient Position: Sitting, Cuff Size: Normal)   Pulse 78   Temp (!) 97 F (36.1 C) (Temporal)   Ht 5\' 5"  (1.651 m)   Wt 190 lb 6.4 oz (86.4 kg)   BMI 31.68 kg/m  Gen: NAD, alert and oriented x 4 HEENT: PEERLA, EOMI, Neck: supple, no JVD Chest: CTA bilaterally, no wheezes, crackles, or other adventitious sounds CV: RRR, no m/g/c/r Abd: soft, NT, ND, +BS in all four quadrants; no HSM, guarding, ridigity, or rebound tenderness Ext: no edema, well perfused with 2+ pulses, Skin: no rash or lesions  noted on observed skin Lymph: no noted LAD  Data:   CT a/p 06/2019 - IMPRESSION: 1. Acute uncomplicated sigmoid colon diverticulitis. 2. Uterine fibroids.  01/2020-vitamin D 25.91, B12 normal, iron 139, ferritin 28, CMP with calcium 8.8, CBC normal with hemoglobin 13, MCV 89.  Hematology notes reviewed   Assessment/Plan: Ms. [place] is a 54 y.o. female  [place] was seen today for diverticulitis.  Diagnoses and all orders for this visit:  Diverticulitis of colon without hemorrhage  Iron deficiency anemia, unspecified iron deficiency anemia type     1. Diverticulitis-has had 3 episodes treated within the past 6 months. Last colonoscopy was 6 years ago and is due for repeat. We discussed limiting seeds nuts, etc. and starting a fiber supplement long-term. She will also make sure she is getting adequate fluid. She needs a colonoscopy for evaluation and this will be scheduled in about 6 weeks.  2. IDA-we discussed in detail, feels this is related to heavy menstrual cycles and has resolved after ablation. No upper GI symptoms. Denies heavy NSAID use. She would like to hold off on endoscopy given the anemia improved. Will contact me if worsens  or develops any GI symptoms   Patient denies CP, SOB, and use of blood thinners. I discussed the risks and benefits of procedure including bleeding, perforation, infection, missed lesions, medication reactions and possible hospitalization or surgery if complications. All questions answered. Patient prefers propofol sedation   I personally performed the service, non-incident to. (WP)  Laurine Blazer, [facility] for Gastrointestinal Disease

## 2020-02-27 NOTE — Patient Instructions (Signed)
Try fiber supplement such as citrucel or benefiber  Make sure you are getting adequate fluid intake  Scheduling colonsocopy

## 2020-02-27 NOTE — Telephone Encounter (Signed)
Patient needs suprep 

## 2020-04-06 MED FILL — CARTIA XT 180 MG CAPSULE SA: 180 | 90 days supply | Qty: 90 | Fill #1

## 2020-04-14 ENCOUNTER — Other Ambulatory Visit (HOSPITAL_COMMUNITY): Payer: Self-pay

## 2020-04-14 DIAGNOSIS — D509 Iron deficiency anemia, unspecified: Secondary | ICD-10-CM

## 2020-04-15 ENCOUNTER — Inpatient Hospital Stay (HOSPITAL_COMMUNITY): Payer: 59 | Attending: Hematology

## 2020-04-15 ENCOUNTER — Other Ambulatory Visit: Payer: Self-pay

## 2020-04-15 DIAGNOSIS — J45909 Unspecified asthma, uncomplicated: Secondary | ICD-10-CM | POA: Insufficient documentation

## 2020-04-15 DIAGNOSIS — E538 Deficiency of other specified B group vitamins: Secondary | ICD-10-CM | POA: Insufficient documentation

## 2020-04-15 DIAGNOSIS — Z803 Family history of malignant neoplasm of breast: Secondary | ICD-10-CM | POA: Diagnosis not present

## 2020-04-15 DIAGNOSIS — Z79899 Other long term (current) drug therapy: Secondary | ICD-10-CM | POA: Diagnosis not present

## 2020-04-15 DIAGNOSIS — I10 Essential (primary) hypertension: Secondary | ICD-10-CM | POA: Insufficient documentation

## 2020-04-15 DIAGNOSIS — Z87891 Personal history of nicotine dependence: Secondary | ICD-10-CM | POA: Insufficient documentation

## 2020-04-15 DIAGNOSIS — G4730 Sleep apnea, unspecified: Secondary | ICD-10-CM | POA: Diagnosis not present

## 2020-04-15 DIAGNOSIS — Z85820 Personal history of malignant melanoma of skin: Secondary | ICD-10-CM | POA: Diagnosis not present

## 2020-04-15 DIAGNOSIS — R5383 Other fatigue: Secondary | ICD-10-CM | POA: Insufficient documentation

## 2020-04-15 DIAGNOSIS — D509 Iron deficiency anemia, unspecified: Secondary | ICD-10-CM | POA: Diagnosis not present

## 2020-04-15 DIAGNOSIS — Z8041 Family history of malignant neoplasm of ovary: Secondary | ICD-10-CM | POA: Insufficient documentation

## 2020-04-15 DIAGNOSIS — Z806 Family history of leukemia: Secondary | ICD-10-CM | POA: Diagnosis not present

## 2020-04-15 DIAGNOSIS — Z800 Family history of malignant neoplasm of digestive organs: Secondary | ICD-10-CM | POA: Diagnosis not present

## 2020-04-15 DIAGNOSIS — Z808 Family history of malignant neoplasm of other organs or systems: Secondary | ICD-10-CM | POA: Insufficient documentation

## 2020-04-15 LAB — FOLATE: Folate: 10.9 ng/mL (ref >5.9)

## 2020-04-15 LAB — VITAMIN B12: Vitamin B-12: 184 pg/mL (ref 180–914)

## 2020-04-17 LAB — COPPER, SERUM: Copper: 108 ug/dL (ref 80–158)

## 2020-04-18 LAB — METHYLMALONIC ACID, SERUM: Methylmalonic Acid, Quantitative: 239 nmol/L (ref 0–378)

## 2020-04-21 ENCOUNTER — Inpatient Hospital Stay (HOSPITAL_COMMUNITY): Payer: 59

## 2020-04-21 ENCOUNTER — Inpatient Hospital Stay (HOSPITAL_COMMUNITY): Payer: 59 | Admitting: Oncology

## 2020-04-21 ENCOUNTER — Other Ambulatory Visit: Payer: Self-pay

## 2020-04-21 ENCOUNTER — Other Ambulatory Visit (HOSPITAL_COMMUNITY): Payer: Self-pay | Admitting: Oncology

## 2020-04-21 VITALS — BP 123/66 | HR 69 | Temp 97.3°F | Resp 18 | Wt 191.8 lb

## 2020-04-21 DIAGNOSIS — J45909 Unspecified asthma, uncomplicated: Secondary | ICD-10-CM | POA: Diagnosis not present

## 2020-04-21 DIAGNOSIS — Z87891 Personal history of nicotine dependence: Secondary | ICD-10-CM | POA: Diagnosis not present

## 2020-04-21 DIAGNOSIS — D509 Iron deficiency anemia, unspecified: Secondary | ICD-10-CM

## 2020-04-21 DIAGNOSIS — I10 Essential (primary) hypertension: Secondary | ICD-10-CM | POA: Diagnosis not present

## 2020-04-21 DIAGNOSIS — Z85820 Personal history of malignant melanoma of skin: Secondary | ICD-10-CM | POA: Diagnosis not present

## 2020-04-21 DIAGNOSIS — E538 Deficiency of other specified B group vitamins: Secondary | ICD-10-CM | POA: Diagnosis not present

## 2020-04-21 DIAGNOSIS — Z79899 Other long term (current) drug therapy: Secondary | ICD-10-CM | POA: Diagnosis not present

## 2020-04-21 DIAGNOSIS — R5383 Other fatigue: Secondary | ICD-10-CM | POA: Diagnosis not present

## 2020-04-21 DIAGNOSIS — G4730 Sleep apnea, unspecified: Secondary | ICD-10-CM | POA: Diagnosis not present

## 2020-04-21 LAB — CBC WITH DIFFERENTIAL/PLATELET
Abs Immature Granulocytes: 0.02 10*3/uL (ref 0.00–0.07)
Basophils Absolute: 0.1 10*3/uL (ref 0.0–0.1)
Basophils Relative: 1 %
Eosinophils Absolute: 0.2 10*3/uL (ref 0.0–0.5)
Eosinophils Relative: 3 %
HCT: 42.1 % (ref 36.0–46.0)
Hemoglobin: 14 g/dL (ref 12.0–15.0)
Immature Granulocytes: 0 %
Lymphocytes Relative: 32 %
Lymphs Abs: 1.8 10*3/uL (ref 0.7–4.0)
MCH: 29.5 pg (ref 26.0–34.0)
MCHC: 33.3 g/dL (ref 30.0–36.0)
MCV: 88.8 fL (ref 80.0–100.0)
Monocytes Absolute: 0.5 10*3/uL (ref 0.1–1.0)
Monocytes Relative: 9 %
Neutro Abs: 3.1 10*3/uL (ref 1.7–7.7)
Neutrophils Relative %: 55 %
Platelets: 248 10*3/uL (ref 150–400)
RBC: 4.74 MIL/uL (ref 3.87–5.11)
RDW: 12.9 % (ref 11.5–15.5)
WBC: 5.6 10*3/uL (ref 4.0–10.5)
nRBC: 0 % (ref 0.0–0.2)

## 2020-04-21 LAB — IRON AND TIBC
Iron: 133 ug/dL (ref 28–170)
Saturation Ratios: 39 % — ABNORMAL HIGH (ref 10.4–31.8)
TIBC: 344 ug/dL (ref 250–450)
UIBC: 211 ug/dL

## 2020-04-21 LAB — FERRITIN: Ferritin: 151 ng/mL (ref 11–307)

## 2020-04-21 MED ORDER — ERGOCALCIFEROL 1.25 MG (50000 UT) PO CAPS: 50000.0000 [IU] | ORAL_CAPSULE | ORAL | 0 refills | Status: DC

## 2020-04-21 MED ORDER — CYANOCOBALAMIN 1000 MCG/ML IJ SOLN: 1000.0000 ug | INTRAMUSCULAR | 0 refills | Status: DC

## 2020-04-21 MED ORDER — CYANOCOBALAMIN 1000 MCG/ML IJ SOLN: 1000.0000 ug | Freq: Once | INTRAMUSCULAR | 0 refills | Status: DC

## 2020-04-21 MED ORDER — CYANOCOBALAMIN 1000 MCG/ML IJ SOLN
1000.0000 ug | Freq: Once | INTRAMUSCULAR | Status: AC
  Administered 2020-04-21: 1000 ug via INTRAMUSCULAR
  Filled 2020-04-21: qty 1

## 2020-04-21 MED FILL — VIT D2 1.25 MG (50,000 UNIT: 1.25 MG | 70 days supply | Qty: 10 | Fill #0

## 2020-04-21 MED FILL — CYANOCOBALAMIN 1,000 MCG/ML: 1000 | 1 days supply | Qty: 1 | Fill #0

## 2020-04-21 NOTE — Progress Notes (Signed)
CONSULT NOTE  Patient Care Team: [name], MD as PCP - General (Internal Medicine)  CHIEF COMPLAINTS/PURPOSE OF CONSULTATION: Iron deficiency anemia  HISTORY OF PRESENTING ILLNESS:  Gina Whitaker 54 y.o. female was sent here by her PCP for iron deficiency anemia. Patient has been iron deficient for many years now. Back in 2015 patient had mild rectal bleeding she was referred to GI. Patient had colonoscopy in which they found 2 polyps that were benign. No other source of bleeding. She has recently had 2 bouts of diverticulitis in February 2021 in June 2021. She is scheduling her repeat colonoscopy within the next couple months. Patient was diagnosed in December 2014 with sleep apnea and she is left with a CPAP machine ever since. In March 2020 patient had heavy menstrual bleeding where her hemoglobin dropped to 8. Patient saw her GYN who scheduled an ablation. Patient had 2 large fibroids. They were unable to ablate behind one of the fibroids. She still has bleeding monthly. Ever since her bleed in March 2020 she has never regained her energy levels. Her hemoglobin remains normal however her ferritin levels are low. Patient was placed on oral iron therapy which causes severe nausea and constipation. Patient denies ever needing a blood transfusion. Patient denies any CKD. Patient denies any alcohol smoking or illicit drug use. Patient denies any pica and eats a variety of diet. Patient has a family history of a mother who has anemia and requires iron transfusions regularly. She is also had melanoma. Patient's father had basal cell carcinoma and prostate cancer. Her paternal grandmother had pancreatic cancer. Paternal aunt had ovarian cancer and lymphoma 20 years later. Paternal aunt had breast cancer. She also has a brother who gets regular phlebotomies and has polycythemia. 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 She lives at home and performs all of her own  ADLs.  She presents back today to review lab work after receiving IV iron back in September.  She received 5 doses of IV Venofer.  She has not noticed a significant change after the IV iron.  She still continues to feel fatigued and tired.  She denies any vaginal bleeding since starting the estrogen/progesterone patches.  She is scheduled to have a colonoscopy at the end of the month.   MEDICAL HISTORY:  Past Medical History:  Diagnosis Date  . Asthma   . Diverticulitis   . Hypertension   . Sleep apnea     SURGICAL HISTORY: Past Surgical History:  Procedure Laterality Date  . APPENDECTOMY    . CHOLECYSTECTOMY    . endometrial ablation    . NECK SURGERY  2017   t lift and fixation  C4 C5 C6 C7   . TEMPOROMANDIBULAR JOINT SURGERY     twice bilateral    SOCIAL HISTORY: Social History   Socioeconomic History  . Marital status: Married    Spouse name: Not on file  . Number of children: 2  . Years of education: Not on file  . Highest education level: Not on file  Occupational History  . Not on file  Tobacco Use  . Smoking status: Former Smoker    Types: Cigarettes    Quit date: 1994    Years since quitting: 27.9  . Smokeless tobacco: Never Used  Vaping Use  . Vaping Use: Never used  Substance and Sexual Activity  . Alcohol use: Never  . Drug use: Never  . Sexual activity: Not Currently  Birth control/protection: Surgical    Comment: ablation  Other Topics Concern  . Not on file  Social History Narrative  . Not on file   Social Determinants of Health   Financial Resource Strain: Low Risk   . Difficulty of Paying Living Expenses: Not hard at all  Food Insecurity: No Food Insecurity  . Worried About Charity fundraiser in the Last Year: Never true  . Ran Out of Food in the Last Year: Never true  Transportation Needs: No Transportation Needs  . Lack of Transportation (Medical): No  . Lack of Transportation (Non-Medical): No  Physical Activity: Insufficiently  Active  . Days of Exercise per Week: 2 days  . Minutes of Exercise per Session: 20 min  Stress: No Stress Concern Present  . Feeling of Stress : Only a little  Social Connections: Moderately Integrated  . Frequency of Communication with Friends and Family: Twice a week  . Frequency of Social Gatherings with Friends and Family: Once a week  . Attends Religious Services: More than 4 times per year  . Active Member of Clubs or Organizations: No  . Attends Archivist Meetings: Never  . Marital Status: Married  Human resources officer Violence: Not At Risk  . Fear of Current or Ex-Partner: No  . Emotionally Abused: No  . Physically Abused: No  . Sexually Abused: No    FAMILY HISTORY: Family History  Problem Relation Age of Onset  . Congestive Heart Failure Paternal Grandfather   . Heart attack Paternal Grandfather   . Dementia Paternal Grandfather   . Basal cell carcinoma Paternal Grandfather   . Cancer Paternal Grandmother        pancreatic  . Aneurysm Maternal Grandmother   . Hypertension Maternal Grandmother   . CAD Maternal Grandmother   . Heart attack Maternal Grandfather   . Mental illness Maternal Grandfather   . Hypertension Father   . Basal cell carcinoma Father   . Cancer Father        prostate  . Glaucoma Father   . Hypertension Mother   . Melanoma Mother   . High Cholesterol Mother   . Irritable bowel syndrome Mother   . Iron deficiency Mother   . Arthritis Mother   . High Cholesterol Brother   . Hypertension Brother   . Other Brother   . Asthma Daughter   . Polycystic ovary syndrome Daughter     ALLERGIES:  is allergic to hydrocodone, latex, tramadol, codeine, erythromycin, morphine, and penicillins.  MEDICATIONS:  Current Outpatient Medications  Medication Sig Dispense Refill  . cetirizine (ZYRTEC) 10 MG tablet Take 10 mg by mouth daily.    Marland Kitchen diltiazem (CARDIZEM CD) 180 MG 24 hr capsule Take 180 mg by mouth daily.    Marland Kitchen docusate sodium (COLACE) 100  MG capsule Take 100 mg by mouth daily.    Marland Kitchen estradiol (VIVELLE-DOT) 0.1 MG/24HR patch Place 1 patch (0.1 mg total) onto the skin 2 (two) times a week. 24 patch 3  . fluticasone (FLONASE) 50 MCG/ACT nasal spray 2 sprays by Each Nare route daily.    . Multiple Vitamin (MULTIVITAMIN) tablet Take 1 tablet by mouth daily. Multivitamin with iron    . ondansetron (ZOFRAN) 4 MG tablet Take 1 tablet (4 mg total) by mouth every 6 (six) hours as needed for nausea or vomiting. 20 tablet 0  . progesterone (PROMETRIUM) 200 MG capsule Take 1 capsule (200 mg total) by mouth daily. nightly 90 capsule 3  . rizatriptan (MAXALT)  10 MG tablet Take 10 mg by mouth daily as needed.     Marland Kitchen acyclovir (ZOVIRAX) 400 MG tablet Take 400 mg by mouth 5 (five) times daily.  (Patient not taking: Reported on 04/21/2020)    . albuterol (VENTOLIN HFA) 108 (90 Base) MCG/ACT inhaler Inhale into the lungs. (Patient not taking: Reported on 04/21/2020)    . metroNIDAZOLE (FLAGYL) 500 MG tablet Take 1 tablet (500 mg total) by mouth 3 (three) times daily. (Patient not taking: Reported on 04/21/2020) 30 tablet 2   No current facility-administered medications for this visit.    REVIEW OF SYSTEMS:   Review of Systems  Constitutional: Positive for malaise/fatigue. Negative for chills, fever and weight loss.  HENT: Negative for congestion, ear pain and tinnitus.   Eyes: Negative.  Negative for blurred vision and double vision.  Respiratory: Negative.  Negative for cough, sputum production and shortness of breath.   Cardiovascular: Negative.  Negative for chest pain, palpitations and leg swelling.  Gastrointestinal: Negative.  Negative for abdominal pain, constipation, diarrhea, nausea and vomiting.  Genitourinary: Negative for dysuria, frequency and urgency.  Musculoskeletal: Positive for back pain and neck pain. Negative for falls.  Skin: Negative.  Negative for rash.  Neurological: Negative.  Negative for weakness and headaches.   Endo/Heme/Allergies: Negative.  Does not bruise/bleed easily.  Psychiatric/Behavioral: Negative.  Negative for depression. The patient is not nervous/anxious and does not have insomnia.    PHYSICAL EXAMINATION: ECOG PERFORMANCE STATUS: 1 - Symptomatic but completely ambulatory  Vitals:   04/21/20 1011  BP: 123/66  Pulse: 69  Resp: 18  Temp: (!) 97.3 F (36.3 C)  SpO2: 99%   Filed Weights   04/21/20 1011  Weight: 191 lb 12.8 oz (87 kg)    Physical Exam Constitutional:      Appearance: Normal appearance.  HENT:     Head: Normocephalic and atraumatic.  Eyes:     Pupils: Pupils are equal, round, and reactive to light.  Cardiovascular:     Rate and Rhythm: Normal rate and regular rhythm.     Heart sounds: Normal heart sounds. No murmur heard.   Pulmonary:     Effort: Pulmonary effort is normal.     Breath sounds: Normal breath sounds. No wheezing.  Abdominal:     General: Bowel sounds are normal. There is no distension.     Palpations: Abdomen is soft.     Tenderness: There is no abdominal tenderness.  Musculoskeletal:        General: Normal range of motion.     Cervical back: Normal range of motion.  Skin:    General: Skin is warm and dry.     Findings: No rash.  Neurological:     Mental Status: She is alert and oriented to person, place, and time.  Psychiatric:        Judgment: Judgment normal.     LABORATORY DATA:  I have reviewed the data as listed Recent Results (from the past 2160 hour(s))  Save Smear (SSMR)     Status: None   Collection Time: 01/23/20  3:05 PM  Result Value Ref Range   Smear Review SMEAR STAINED AND AVAILABLE FOR REVIEW     Comment: Performed at [facility], [address], [place], [place] [address]  Reticulocytes     Status: None   Collection Time: 01/23/20  3:05 PM  Result Value Ref Range   Retic Ct Pct 1.1 0.4 - 3.1 %   RBC. 4.53 3.87 - 5.11 MIL/uL  Retic Count, Absolute 51.6 19.0 - 186.0 K/uL   Immature Retic Fract 4.6 2.3 -  15.9 %    Comment: Performed at [facility], [address], [place], [place] [address]  Protein electrophoresis, serum     Status: None   Collection Time: 01/23/20  3:05 PM  Result Value Ref Range   Total Protein ELP 6.4 6.0 - 8.5 g/dL   Albumin ELP 3.7 2.9 - 4.4 g/dL   Alpha-1-Globulin 0.2 0.0 - 0.4 g/dL   Alpha-2-Globulin 0.7 0.4 - 1.0 g/dL   Beta Globulin 1.0 0.7 - 1.3 g/dL   Gamma Globulin 0.8 0.4 - 1.8 g/dL   M-Spike, % Not Observed Not Observed g/dL   SPE Interp. Comment     Comment: (NOTE) The SPE pattern appears unremarkable. Evidence of monoclonal protein is not apparent. Performed At: [facility] [place], [place] [id number] Rush Farmer MD [id number]    Comment Comment     Comment: (NOTE) Protein electrophoresis scan will follow via computer, mail, or courier delivery.    Globulin, Total 2.7 2.2 - 3.9 g/dL   A/G Ratio 1.4 0.7 - 1.7  Copper, serum     Status: None   Collection Time: 01/23/20  3:05 PM  Result Value Ref Range   Copper 118 80 - 158 ug/dL    Comment: (NOTE) This test was developed and its performance characteristics determined by [facility]. It has not been cleared or approved by the Food and Drug Administration.                                Detection Limit = 5 Performed At: [facility] [place], [place] [id number] Rush Farmer MD [id number]   Haptoglobin     Status: None   Collection Time: 01/23/20  3:05 PM  Result Value Ref Range   Haptoglobin 135 33 - 346 mg/dL    Comment: (NOTE) Performed At: [facility] [place], [place] [id number] Rush Farmer MD [id number]   Folate     Status: None   Collection Time: 01/23/20  3:05 PM  Result Value Ref Range   Folate 11.7 >5.9 ng/mL    Comment: Performed at [facility], [address], [place], [place] [address]  VITAMIN D 25 Hydroxy (Vit-D Deficiency, Fractures)     Status: Abnormal   Collection Time: 01/23/20  3:05  PM  Result Value Ref Range   Vit D, 25-Hydroxy 27.68 (L) 30 - 100 ng/mL    Comment: (NOTE) Vitamin D deficiency has been defined by the Institute of Medicine  and an Endocrine Society practice guideline as a level of serum 25-OH  vitamin D less than 20 ng/mL (1,2). The Endocrine Society went on to  further define vitamin D insufficiency as a level between 21 and 29  ng/mL (2).  1. IOM (Institute of Medicine). 2010. Dietary reference intakes for  calcium and D. [place]: The [employer]. 2. Holick MF, Binkley [place], Bischoff-Ferrari HA, et al. Evaluation,  treatment, and prevention of vitamin D deficiency: an Endocrine  Society clinical practice guideline, JCEM. 2011 Jul; 96(7): 1911-30.  Performed at [facility], [place] [address] [place], [place] [address]   Vitamin B12     Status: None   Collection Time: 01/23/20  3:05 PM  Result Value Ref Range   Vitamin B-12 202 180 - 914 pg/mL    Comment: (NOTE) This  assay is not validated for testing neonatal or myeloproliferative syndrome specimens for Vitamin B12 levels. Performed at [facility], [address], [place], [place] [address]   Methylmalonic acid, serum     Status: None   Collection Time: 01/23/20  3:05 PM  Result Value Ref Range   Methylmalonic Acid, Quantitative 134 0 - 378 nmol/L   Disclaimer: Comment     Comment: (NOTE) This test was developed and its performance characteristics determined by [facility]. It has not been cleared or approved by the Food and Drug Administration. Performed At: [facility] [place], [place] [id number] Rush Farmer MD [id number]   Lactate dehydrogenase     Status: None   Collection Time: 01/23/20  3:05 PM  Result Value Ref Range   LDH 124 98 - 192 U/L    Comment: Performed at [facility], [address] [place], [place] [address]  CBC with Differential/Platelet     Status: None   Collection Time: 01/23/20  3:05 PM  Result Value Ref Range    WBC 5.9 4.0 - 10.5 K/uL   RBC 4.52 3.87 - 5.11 MIL/uL   Hemoglobin 13.0 12.0 - 15.0 g/dL   HCT 40.3 36 - 46 %   MCV 89.2 80.0 - 100.0 fL   MCH 28.8 26.0 - 34.0 pg   MCHC 32.3 30.0 - 36.0 g/dL   RDW 13.3 11.5 - 15.5 %   Platelets 253 150 - 400 K/uL   nRBC 0.0 0.0 - 0.2 %   Neutrophils Relative % 56 %   Neutro Abs 3.3 1.7 - 7.7 K/uL   Lymphocytes Relative 32 %   Lymphs Abs 1.9 0.7 - 4.0 K/uL   Monocytes Relative 8 %   Monocytes Absolute 0.5 0.1 - 1.0 K/uL   Eosinophils Relative 3 %   Eosinophils Absolute 0.2 0.0 - 0.5 K/uL   Basophils Relative 1 %   Basophils Absolute 0.1 0.0 - 0.1 K/uL   Immature Granulocytes 0 %   Abs Immature Granulocytes 0.01 0.00 - 0.07 K/uL    Comment: Performed at [facility], [address], [place], [place] [address]  Comprehensive metabolic panel     Status: Abnormal   Collection Time: 01/23/20  3:05 PM  Result Value Ref Range   Sodium 137 135 - 145 mmol/L   Potassium 3.9 3.5 - 5.1 mmol/L   Chloride 103 98 - 111 mmol/L   CO2 26 22 - 32 mmol/L   Glucose, Bld 89 70 - 99 mg/dL    Comment: Glucose reference range applies only to samples taken after fasting for at least 8 hours.   BUN 14 6 - 20 mg/dL   Creatinine, Ser 0.76 0.44 - 1.00 mg/dL   Calcium 8.8 (L) 8.9 - 10.3 mg/dL   Total Protein 7.1 6.5 - 8.1 g/dL   Albumin 4.0 3.5 - 5.0 g/dL   AST 16 15 - 41 U/L   ALT 21 0 - 44 U/L   Alkaline Phosphatase 49 38 - 126 U/L   Total Bilirubin 0.7 0.3 - 1.2 mg/dL   GFR calc non Af Amer >60 >60 mL/min   GFR calc Af Amer >60 >60 mL/min   Anion gap 8 5 - 15    Comment: Performed at [facility], [address] [place], [place] [address]  Ferritin     Status: None   Collection Time: 01/23/20  3:05 PM  Result Value Ref Range   Ferritin 28 11 - 307 ng/mL  Comment: Performed at [facility], [address] [place], [place] [address]  Iron and TIBC     Status: Abnormal   Collection Time: 01/23/20  3:05 PM  Result Value Ref Range   Iron 139 28 - 170 ug/dL    TIBC 403 250 - 450 ug/dL   Saturation Ratios 34 (H) 10.4 - 31.8 %   UIBC 264 ug/dL    Comment: Performed at [facility], [address], [place], [place] [address]  Lactate dehydrogenase     Status: None   Collection Time: 01/23/20  3:05 PM  Result Value Ref Range   LDH 117 98 - 192 U/L    Comment: Performed at [facility], [address], [place], [place] [address]  Vitamin B12     Status: None   Collection Time: 01/23/20  3:05 PM  Result Value Ref Range   Vitamin B-12 181 180 - 914 pg/mL    Comment: (NOTE) This assay is not validated for testing neonatal or myeloproliferative syndrome specimens for Vitamin B12 levels. Performed at [facility], [address], [place], [place] [address]   VITAMIN D 25 Hydroxy (Vit-D Deficiency, Fractures)     Status: Abnormal   Collection Time: 01/23/20  3:05 PM  Result Value Ref Range   Vit D, 25-Hydroxy 25.91 (L) 30 - 100 ng/mL    Comment: (NOTE) Vitamin D deficiency has been defined by the [place] practice guideline as a level of serum 25-OH  vitamin D less than 20 ng/mL (1,2). The Endocrine Society went on to  further define vitamin D insufficiency as a level between 21 and 29  ng/mL (2).  1. IOM (Institute of Medicine). 2010. Dietary reference intakes for  calcium and D. [place]: The [employer]. 2. Holick MF, Binkley [place], Bischoff-Ferrari HA, et al. Evaluation,  treatment, and prevention of vitamin D deficiency: an Endocrine  Society clinical practice guideline, JCEM. 2011 Jul; 96(7): 1911-30.  Performed at [facility], [place] [address] [place], [place] [address]   Folate     Status: None   Collection Time: 04/15/20  3:08 PM  Result Value Ref Range   Folate 10.9 >5.9 ng/mL    Comment: Performed at [facility], [address] [place], [place] [address]  Methylmalonic acid, serum     Status: None   Collection Time: 04/15/20  3:08 PM  Result Value Ref Range    Methylmalonic Acid, Quantitative 239 0 - 378 nmol/L   Disclaimer: Comment     Comment: (NOTE) This test was developed and its performance characteristics determined by [facility]. It has not been cleared or approved by the Food and Drug Administration. Performed At: [facility] [address] [place], [place] [id number] Rush Farmer MD [id number]   Copper, serum     Status: None   Collection Time: 04/15/20  3:08 PM  Result Value Ref Range   Copper 108 80 - 158 ug/dL    Comment: (NOTE) This test was developed and its performance characteristics determined by [facility]. It has not been cleared or approved by the Food and Drug Administration.                                Detection Limit = 5 Performed At: [facility] [address] [place], [place] [id number] Rush Farmer MD [id number]   Vitamin B12     Status: None  Collection Time: 04/15/20  3:14 PM  Result Value Ref Range   Vitamin B-12 184 180 - 914 pg/mL    Comment: (NOTE) This assay is not validated for testing neonatal or myeloproliferative syndrome specimens for Vitamin B12 levels. Performed at [facility], [address] [place], [place] [address]     RADIOGRAPHIC STUDIES: I have personally reviewed the radiological images as listed and agreed with the findings in the report. I have independently interviewed this patient and agree with HPI written by my nurse practitioner Wenda Low, FNP. I have independently formulated my assessment and plan. ASSESSMENT & PLAN:  1. Normocytic anemia and iron deficiency state: -CBC from Dr. Ria Comment office shows hemoglobin 11.7, MCV 83, ferritin of 16. -She has been taking iron tablet once every 2 to 3 days. She could not tolerate more than that due to constipation. -She has iron deficiency state from menstrual bleeding. -She has severe fatigue. She is not able to work more than 2 shifts back-to-back. 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 She also has a history of  rectal bleeding 2015.  -She has scheduled colonoscopy for the end of the month. -Reviewed lab work from 04/15/2020.  Copper, MMA and folate level are all WNL.  Her vitamin B12 is 184 (low end of normal). Female -We will repeat CBC, ferritin and iron levels today. -B12 level is on the low end of normal.  We will go ahead and give her a B12 injection today. She would like a prescription sent to her pharmacy so that she can give them at home to herself. -Plan to recheck lab work in 3 months (CBC, CMP, vitamin B12, vitamin D and iron levels, ferritin).  Addendum: Add on labs from today show a ferritin of 151 (previously 28), iron saturation 39% (34%) and a hemoglobin of 14 (13).  No additional IV iron needed at this time.  Plan to repeat labs in 3 months.   Greater than 50% was spent in counseling and coordination of care with this patient including but not limited to discussion of the relevant topics above (See A&P) including, but not limited to diagnosis and management of acute and chronic medical conditions.   All questions were answered. The patient knows to call the clinic with any problems, questions or concerns.   Jacquelin Hawking, NP 04/21/20 10:32 AM

## 2020-04-21 NOTE — Progress Notes (Signed)
Patient tolerated B12 injection with no complaints voiced.  Site clean and dry with no bruising or swelling noted at site.  Band aid applied.  VSS with discharge and left ambulatory with no s/s of distress noted.  Pt asked if she could get the B12 injections at home.  I notified Rulon Abide, NP, who stated that she would write the prescription for the pt to get the medication and supplies at home.

## 2020-05-04 ENCOUNTER — Other Ambulatory Visit (HOSPITAL_COMMUNITY): Payer: Self-pay | Admitting: Internal Medicine

## 2020-05-04 DIAGNOSIS — D509 Iron deficiency anemia, unspecified: Secondary | ICD-10-CM | POA: Diagnosis not present

## 2020-05-04 DIAGNOSIS — I10 Essential (primary) hypertension: Secondary | ICD-10-CM | POA: Diagnosis not present

## 2020-05-04 MED FILL — ACYCLOVIR 400 MG TABLET: 400 | 5 days supply | Qty: 25 | Fill #0

## 2020-05-06 NOTE — Patient Instructions (Signed)
Your procedure is scheduled on: 05/13/2020  Report to [place] [facility] at 6:15    AM.  Call this number if you have problems the morning of surgery: [phone]   Remember:              Follow Directions on the letter you received from Your Physician's office regarding the Bowel Prep              No Smoking the day of Procedure :   Take these medicines the morning of surgery with A SIP OF WATER:Diltiazem, Zyrtec, and Flonase                (zofran, maxalt, and or inhaler if needed)   Do not wear jewelry, make-up or nail polish.    Do not bring valuables to the hospital.  Contacts, dentures or bridgework may not be worn into surgery.  .   Patients discharged the day of surgery will not be allowed to drive home.     Colonoscopy, Adult, Care After This sheet gives you information about how to care for yourself after your procedure. Your health care provider may also give you more specific instructions. If you have problems or questions, contact your health care provider. What can I expect after the procedure? After the procedure, it is common to have:  A small amount of blood in your stool for 24 hours after the procedure.  Some gas.  Mild abdominal cramping or bloating.  Follow these instructions at home: General instructions   For the first 24 hours after the procedure: ? Do not drive or use machinery. ? Do not sign important documents. ? Do not drink alcohol. ? Do your regular daily activities at a slower pace than normal. ? Eat soft, easy-to-digest foods. ? Rest often.  Take over-the-counter or prescription medicines only as told by your health care provider.  It is up to you to get the results of your procedure. Ask your health care provider, or the department performing the procedure, when your results will be ready. Relieving cramping and bloating  Try walking around when you have cramps or feel bloated.  Apply heat to your abdomen as told by your  health care provider. Use a heat source that your health care provider recommends, such as a moist heat pack or a heating pad. ? Place a towel between your skin and the heat source. ? Leave the heat on for 20-30 minutes. ? Remove the heat if your skin turns bright red. This is especially important if you are unable to feel pain, heat, or cold. You may have a greater risk of getting burned. Eating and drinking  Drink enough fluid to keep your urine clear or pale yellow.  Resume your normal diet as instructed by your health care provider. Avoid heavy or fried foods that are hard to digest.  Avoid drinking alcohol for as long as instructed by your health care provider. Contact a health care provider if:  You have blood in your stool 2-3 days after the procedure. Get help right away if:  You have more than a small spotting of blood in your stool.  You pass large blood clots in your stool.  Your abdomen is swollen.  You have nausea or vomiting.  You have a fever.  You have increasing abdominal pain that is not relieved with medicine. This information is not intended to replace advice given to you by your health care provider. Make sure you discuss any  questions you have with your health care provider. Document Released: 12/15/2003 Document Revised: 01/25/2016 Document Reviewed: 07/14/2015 Elsevier Interactive Patient Education  [employer].

## 2020-05-12 ENCOUNTER — Other Ambulatory Visit (HOSPITAL_COMMUNITY)
Admission: RE | Admit: 2020-05-12 | Discharge: 2020-05-12 | Disposition: A | Discharge status: Home / Self Care | Payer: 59 | Source: Ambulatory Visit | Attending: Internal Medicine | Admitting: Internal Medicine

## 2020-05-12 ENCOUNTER — Other Ambulatory Visit: Payer: Self-pay

## 2020-05-12 ENCOUNTER — Encounter (HOSPITAL_COMMUNITY)
Admission: RE | Admit: 2020-05-12 | Discharge: 2020-05-12 | Disposition: A | Discharge status: Home / Self Care | Payer: 59 | Source: Ambulatory Visit | Attending: Internal Medicine | Admitting: Internal Medicine

## 2020-05-12 ENCOUNTER — Encounter (HOSPITAL_COMMUNITY): Payer: Self-pay

## 2020-05-12 DIAGNOSIS — Z01818 Encounter for other preprocedural examination: Secondary | ICD-10-CM | POA: Diagnosis not present

## 2020-05-12 DIAGNOSIS — Z20822 Contact with and (suspected) exposure to covid-19: Secondary | ICD-10-CM | POA: Insufficient documentation

## 2020-05-12 DIAGNOSIS — I10 Essential (primary) hypertension: Secondary | ICD-10-CM | POA: Insufficient documentation

## 2020-05-12 LAB — SARS CORONAVIRUS 2 (TAT 6-24 HRS): SARS Coronavirus 2: NEGATIVE

## 2020-05-12 LAB — PREGNANCY, URINE: Preg Test, Ur: NEGATIVE

## 2020-05-12 MED FILL — CYANOCOBALAMIN 1,000 MCG/ML: 1000 | 90 days supply | Qty: 3 | Fill #0

## 2020-05-13 ENCOUNTER — Ambulatory Visit (HOSPITAL_COMMUNITY)
Admission: RE | Admit: 2020-05-13 | Discharge: 2020-05-13 | Disposition: A | Discharge status: Home / Self Care | Payer: 59 | Attending: Internal Medicine | Admitting: Internal Medicine

## 2020-05-13 ENCOUNTER — Ambulatory Visit (HOSPITAL_COMMUNITY): Payer: 59 | Admitting: Certified Registered"

## 2020-05-13 ENCOUNTER — Encounter (HOSPITAL_COMMUNITY)
Admission: RE | Disposition: A | Discharge status: Home / Self Care | Payer: Self-pay | Source: Home / Self Care | Attending: Internal Medicine

## 2020-05-13 ENCOUNTER — Encounter (HOSPITAL_COMMUNITY): Payer: Self-pay | Admitting: Internal Medicine

## 2020-05-13 DIAGNOSIS — K5730 Diverticulosis of large intestine without perforation or abscess without bleeding: Secondary | ICD-10-CM

## 2020-05-13 DIAGNOSIS — K6289 Other specified diseases of anus and rectum: Secondary | ICD-10-CM | POA: Insufficient documentation

## 2020-05-13 DIAGNOSIS — K644 Residual hemorrhoidal skin tags: Secondary | ICD-10-CM

## 2020-05-13 DIAGNOSIS — K5792 Diverticulitis of intestine, part unspecified, without perforation or abscess without bleeding: Secondary | ICD-10-CM | POA: Diagnosis present

## 2020-05-13 DIAGNOSIS — Z09 Encounter for follow-up examination after completed treatment for conditions other than malignant neoplasm: Secondary | ICD-10-CM | POA: Insufficient documentation

## 2020-05-13 DIAGNOSIS — Z8719 Personal history of other diseases of the digestive system: Secondary | ICD-10-CM

## 2020-05-13 HISTORY — PX: COLONOSCOPY WITH PROPOFOL: SHX5780

## 2020-05-13 LAB — HM COLONOSCOPY

## 2020-05-13 SURGERY — COLONOSCOPY WITH PROPOFOL
Anesthesia: General

## 2020-05-13 MED ORDER — LACTATED RINGERS IV SOLN: INTRAVENOUS | Status: DC

## 2020-05-13 MED ORDER — PROPOFOL 10 MG/ML IV BOLUS
INTRAVENOUS | Status: AC
  Filled 2020-05-13: qty 220

## 2020-05-13 MED ORDER — STERILE WATER FOR IRRIGATION IR SOLN
Status: DC | PRN
  Administered 2020-05-13: 08:00:00 100 mL

## 2020-05-13 MED ORDER — PROPOFOL 10 MG/ML IV BOLUS
INTRAVENOUS | Status: DC | PRN
  Administered 2020-05-13: 150 ug/kg/min via INTRAVENOUS
  Administered 2020-05-13: 100 mg via INTRAVENOUS

## 2020-05-13 MED ORDER — METAMUCIL SMOOTH TEXTURE 58.6 % PO POWD: 1.0000 | Freq: Every day | ORAL | Status: DC

## 2020-05-13 NOTE — Anesthesia Preprocedure Evaluation (Signed)
Anesthesia Evaluation  Patient identified by MRN, date of birth, ID band Patient awake    Reviewed: Allergy & Precautions, H&P , NPO status , Patient's Chart, lab work & pertinent test results, reviewed documented beta blocker date and time   Airway Mallampati: II  TM Distance: >3 FB Neck ROM: full    Dental no notable dental hx.    Pulmonary asthma , sleep apnea , former smoker,    Pulmonary exam normal breath sounds clear to auscultation       Cardiovascular Exercise Tolerance: Good hypertension, negative cardio ROS   Rhythm:regular Rate:Normal     Neuro/Psych negative neurological ROS  negative psych ROS   GI/Hepatic negative GI ROS, Neg liver ROS,   Endo/Other  negative endocrine ROS  Renal/GU negative Renal ROS  negative genitourinary   Musculoskeletal   Abdominal   Peds  Hematology  (+) Blood dyscrasia, anemia ,   Anesthesia Other Findings   Reproductive/Obstetrics negative OB ROS                             Anesthesia Physical Anesthesia Plan  ASA: II  Anesthesia Plan: General   Post-op Pain Management:    Induction:   PONV Risk Score and Plan: Propofol infusion  Airway Management Planned:   Additional Equipment:   Intra-op Plan:   Post-operative Plan:   Informed Consent: I have reviewed the patients History and Physical, chart, labs and discussed the procedure including the risks, benefits and alternatives for the proposed anesthesia with the patient or authorized representative who has indicated his/her understanding and acceptance.     Dental Advisory Given  Plan Discussed with: CRNA  Anesthesia Plan Comments:         Anesthesia Quick Evaluation

## 2020-05-13 NOTE — Anesthesia Postprocedure Evaluation (Signed)
Anesthesia Post Note  Patient: Water quality scientist) Performed: COLONOSCOPY WITH PROPOFOL (N/A )  Patient location during evaluation: PACU Anesthesia Type: General Level of consciousness: awake, oriented, awake and alert and patient cooperative Pain management: pain level controlled Vital Signs Assessment: post-procedure vital signs reviewed and stable Respiratory status: spontaneous breathing, respiratory function stable and nonlabored ventilation Cardiovascular status: blood pressure returned to baseline and stable Postop Assessment: no headache and no backache Anesthetic complications: no   No complications documented.   Last Vitals:  Vitals:   05/13/20 0637  BP: 125/72  Resp: 13  Temp: 36.6 C  SpO2: 97%    Last Pain:  Vitals:   05/13/20 0726  TempSrc:   PainSc: 0-No pain                 Brynda Peon

## 2020-05-13 NOTE — H&P (Signed)
Gina Whitaker is an 54 y.o. female.   Chief Complaint: Patient is here for colonoscopy. HPI: Patient is 53 year old Caucasian female who has history of colonic adenomas with last exam in 2015 at [facility] who is here for colonoscopy.  She also gives history of diverticulitis.  She has had 3 episodes this year and each time she has responded to antibiotics.  She also has had intermittent postprandial diarrhea since cholecystectomy.  Presently she is not having any abdominal pain.  She denies frank rectal bleeding.  Time she noticed blood on the tissue which she has had 1 prior to her last colonoscopy.  Family history is negative for CRC or IBD.  Past Medical History:  Diagnosis Date  . Asthma   . Diverticulitis   . Hypertension   . Sleep apnea     Past Surgical History:  Procedure Laterality Date  . APPENDECTOMY    . CHOLECYSTECTOMY    . endometrial ablation    . NECK SURGERY  2017   t lift and fixation  C4 C5 C6 C7   . TEMPOROMANDIBULAR JOINT SURGERY     twice bilateral    Family History  Problem Relation Age of Onset  . Congestive Heart Failure Paternal Grandfather   . Heart attack Paternal Grandfather   . Dementia Paternal Grandfather   . Basal cell carcinoma Paternal Grandfather   . Cancer Paternal Grandmother        pancreatic  . Aneurysm Maternal Grandmother   . Hypertension Maternal Grandmother   . CAD Maternal Grandmother   . Heart attack Maternal Grandfather   . Mental illness Maternal Grandfather   . Hypertension Father   . Basal cell carcinoma Father   . Cancer Father        prostate  . Glaucoma Father   . Hypertension Mother   . Melanoma Mother   . High Cholesterol Mother   . Irritable bowel syndrome Mother   . Iron deficiency Mother   . Arthritis Mother   . High Cholesterol Brother   . Hypertension Brother   . Other Brother   . Asthma Daughter   . Polycystic ovary syndrome Daughter    Social History:  reports that she quit smoking about 28 years ago.  Her smoking use included cigarettes. She has a 18.00 pack-year smoking history. She has never used smokeless tobacco. She reports that she does not drink alcohol and does not use drugs.  Allergies:  Allergies  Allergen Reactions  . Hydrocodone Rash and Swelling  . Latex Swelling and Rash  . Tramadol     Rash and facial swelling  . Erythromycin Nausea And Vomiting  . Watermelon [Citrullus Vulgaris]     Oral itch syndrome   . Codeine Itching and Rash  . Morphine Itching, Swelling and Rash  . Penicillins Rash    Medications Prior to Admission  Medication Sig Dispense Refill  . acyclovir (ZOVIRAX) 400 MG tablet Take 400 mg by mouth 5 (five) times daily as needed (fever blisters).    Marland Kitchen albuterol (VENTOLIN HFA) 108 (90 Base) MCG/ACT inhaler Inhale into the lungs every 4 (four) hours as needed for wheezing or shortness of breath.    . cetirizine (ZYRTEC) 10 MG tablet Take 10 mg by mouth daily.    . cyanocobalamin (,VITAMIN B-12,) 1000 MCG/ML injection Inject 1 mL (1,000 mcg total) into the muscle every 30 (thirty) days. 10 mL 0  . diltiazem (CARDIZEM CD) 180 MG 24 hr capsule Take 180 mg by mouth daily.    Marland Kitchen  docusate sodium (COLACE) 100 MG capsule Take 100 mg by mouth daily.    . ergocalciferol (VITAMIN D2) 1.25 MG (50000 UT) capsule Take 1 capsule (50,000 Units total) by mouth once a week. (Patient taking differently: Take 50,000 Units by mouth every Thursday.) 10 capsule 0  . estradiol (VIVELLE-DOT) 0.1 MG/24HR patch Place 1 patch (0.1 mg total) onto the skin 2 (two) times a week. (Patient taking differently: Place 1 patch onto the skin 2 (two) times a week. Changes on Mondays and Thursdays) 24 patch 3  . fluticasone (FLONASE) 50 MCG/ACT nasal spray Place 2 sprays into both nostrils daily.    . Multiple Vitamins-Minerals (ADULT GUMMY PO) Take 2 capsules by mouth daily.    . ondansetron (ZOFRAN) 4 MG tablet Take 1 tablet (4 mg total) by mouth every 6 (six) hours as needed for nausea or  vomiting. 20 tablet 0  . progesterone (PROMETRIUM) 200 MG capsule Take 1 capsule (200 mg total) by mouth daily. nightly (Patient taking differently: Take 200 mg by mouth at bedtime.) 90 capsule 3  . rizatriptan (MAXALT) 10 MG tablet Take 10 mg by mouth daily as needed for migraine.    . metroNIDAZOLE (FLAGYL) 500 MG tablet Take 1 tablet (500 mg total) by mouth 3 (three) times daily. (Patient not taking: No sig reported) 30 tablet 2    Results for orders placed or performed during the hospital encounter of 05/12/20 (from the past 48 hour(s))  Pregnancy, urine     Status: None   Collection Time: 05/12/20  8:18 AM  Result Value Ref Range   Preg Test, Ur NEGATIVE NEGATIVE    Comment:        THE SENSITIVITY OF THIS METHODOLOGY IS >20 mIU/mL. Performed at [facility], [address] [place], [place] [address]   SARS CORONAVIRUS 2 (TAT 6-24 HRS) Nasopharyngeal Nasopharyngeal Swab     Status: None   Collection Time: 05/12/20 11:54 AM   Specimen: Nasopharyngeal Swab  Result Value Ref Range   SARS Coronavirus 2 NEGATIVE NEGATIVE    Comment: (NOTE) SARS-CoV-2 target nucleic acids are NOT DETECTED.  The SARS-CoV-2 RNA is generally detectable in upper and lower respiratory specimens during the acute phase of infection. Negative results do not preclude SARS-CoV-2 infection, do not rule out co-infections with other pathogens, and should not be used as the sole basis for treatment or other patient management decisions. Negative results must be combined with clinical observations, patient history, and epidemiological information. The expected result is Negative.  Fact Sheet for Patients: SugarRoll.be  Fact Sheet for Healthcare Providers: https://www.woods-mathews.com/  This test is not yet approved or cleared by the [place] FDA and  has been authorized for detection and/or diagnosis of SARS-CoV-2 by FDA under an Emergency Use Authorization (EUA).  This EUA will remain  in effect (meaning this test can be used) for the duration of the COVID-19 declaration under Se ction 564(b)(1) of the Act, 21 U.S.C. section 360bbb-3(b)(1), unless the authorization is terminated or revoked sooner.  Performed at [facility], [place] [address], [place],  [address]    No results found.  Review of Systems  Blood pressure 125/72, temperature 97.9 F (36.6 C), temperature source Oral, resp. rate 13, SpO2 97 %. Physical Exam HENT:     Mouth/Throat:     Mouth: Mucous membranes are moist.     Pharynx: Oropharynx is clear.  Eyes:     Conjunctiva/sclera: Conjunctivae normal.  Neck:     Comments: Scar along  left side of anterior neck Cardiovascular:     Rate and Rhythm: Normal rate and regular rhythm.     Heart sounds: Normal heart sounds. No murmur heard.   Pulmonary:     Effort: Pulmonary effort is normal.     Breath sounds: Normal breath sounds.  Abdominal:     Comments: Abdomen is full.  She is appendectomy scar.  On palpation abdomen is soft and nontender with organomegaly or masses.  Musculoskeletal:        General: No swelling.     Cervical back: Neck supple.  Skin:    General: Skin is warm and dry.  Neurological:     Mental Status: She is alert.      Assessment/Plan  History of colonic adenomas. History of diverticulitis. Diagnostic colonoscopy.  Lionel December, MD 05/13/2020, 7:23 AM

## 2020-05-13 NOTE — Op Note (Signed)
[facility] Patient Name: Gina Whitaker Procedure Date: 05/13/2020 7:05 AM MRN: [id number] Date of Birth: 01-30-1966 Attending MD: Lionel December , MD CSN: [id number] Age: 54 Admit Type: Outpatient Procedure:                Colonoscopy Indications:              Follow-up of diverticulitis Providers:                Lionel December, MD, Jacoby Page, Pandora Leiter,                            Technician Referring MD:             Carylon Perches, MD Medicines:                Propofol per Anesthesia Complications:            No immediate complications. Estimated Blood Loss:     Estimated blood loss: none. Procedure:                Pre-Anesthesia Assessment:                           - Prior to the procedure, a History and Physical                            was performed, and patient medications and                            allergies were reviewed. The patient's tolerance of                            previous anesthesia was also reviewed. The risks                            and benefits of the procedure and the sedation                            options and risks were discussed with the patient.                            All questions were answered, and informed consent                            was obtained. Prior Anticoagulants: The patient has                            taken no previous anticoagulant or antiplatelet                            agents. ASA Grade Assessment: II - A patient with                            mild systemic disease. After reviewing the risks  and benefits, the patient was deemed in                            satisfactory condition to undergo the procedure.                           After obtaining informed consent, the colonoscope                            was passed under direct vision. Throughout the                            procedure, the patient's blood pressure, pulse, and                            oxygen saturations  were monitored continuously. The                            [id number]) was introduced through the anus                            and advanced to the the cecum, identified by                            appendiceal orifice and ileocecal valve. The                            colonoscopy was performed without difficulty. The                            patient tolerated the procedure well. The quality                            of the bowel preparation was good. The ileocecal                            valve, appendiceal orifice, and rectum were                            photographed. Scope In: 7:30:24 AM Scope Out: [id number] AM Scope Withdrawal Time: 0 hours 9 minutes 23 seconds  Total Procedure Duration: 0 hours 17 minutes 20 seconds  Findings:      The perianal and digital rectal examinations were normal.      Multiple diverticula were found in the sigmoid colon.      The exam was otherwise normal throughout the examined colon.      External hemorrhoids were found during retroflexion. The hemorrhoids       were small.      Anal papilla(e) were hypertrophied. Impression:               - Diverticulosis in the sigmoid colon.                           - External hemorrhoids.                           -  Anal papilla(e) were hypertrophied.                           - No specimens collected. Moderate Sedation:      Per Anesthesia Care Recommendation:           - Patient has a contact number available for                            emergencies. The signs and symptoms of potential                            delayed complications were discussed with the                            patient. Return to normal activities tomorrow.                            Written discharge instructions were provided to the                            patient.                           - High fiber diet today.                           - Continue present medications.                           - Use original  regular Metamucil one packet PO                            daily.                           - Repeat colonoscopy in 7 years for surveillance. Procedure Code(s):        --- Professional ---                           [phone], Colonoscopy, flexible; diagnostic, including                            collection of specimen(s) by brushing or washing,                            when performed (separate procedure) Diagnosis Code(s):        --- Professional ---                           K64.4, Residual hemorrhoidal skin tags                           K62.89, Other specified diseases of anus and rectum                           K57.32, Diverticulitis of large intestine without  perforation or abscess without bleeding                           K57.30, Diverticulosis of large intestine without                            perforation or abscess without bleeding CPT copyright 2019 American Medical Association. All rights reserved. The codes documented in this report are preliminary and upon coder review may  be revised to meet current compliance requirements. Hildred Laser, MD Hildred Laser, MD 05/13/2020 7:59:58 AM This report has been signed electronically. Number of Addenda: 0

## 2020-05-13 NOTE — Discharge Instructions (Signed)
Resume usual medications as before Metamucil 1 packet or 4 g by mouth daily. High-fiber diet. No driving for 24 hours. Next colonoscopy in 7 years.   Colonoscopy, Adult, Care After This sheet gives you information about how to care for yourself after your procedure. Your doctor may also give you more specific instructions. If you have problems or questions, call your doctor. What can I expect after the procedure? After the procedure, it is common to have:  A small amount of blood in your poop (stool) for 24 hours.  Some gas.  Mild cramping or bloating in your belly (abdomen). Follow these instructions at home: Eating and drinking   Drink enough fluid to keep your pee (urine) pale yellow.  Follow instructions from your doctor about what you cannot eat or drink.  Return to your normal diet as told by your doctor. Avoid heavy or fried foods that are hard to digest. Activity  Rest as told by your doctor.  Do not sit for a long time without moving. Get up to take short walks every 1-2 hours. This is important. Ask for help if you feel weak or unsteady.  Return to your normal activities as told by your doctor. Ask your doctor what activities are safe for you. To help cramping and bloating:   Try walking around.  Put heat on your belly as told by your doctor. Use the heat source that your doctor recommends, such as a moist heat pack or a heating pad. ? Put a towel between your skin and the heat source. ? Leave the heat on for 20-30 minutes. ? Remove the heat if your skin turns bright red. This is very important if you are unable to feel pain, heat, or cold. You may have a greater risk of getting burned. General instructions  For the first 24 hours after the procedure: ? Do not drive or use machinery. ? Do not sign important documents. ? Do not drink alcohol. ? Do your daily activities more slowly than normal. ? Eat foods that are soft and easy to digest.  Take  over-the-counter or prescription medicines only as told by your doctor.  Keep all follow-up visits as told by your doctor. This is important. Contact a doctor if:  You have blood in your poop 2-3 days after the procedure. Get help right away if:  You have more than a small amount of blood in your poop.  You see large clumps of tissue (blood clots) in your poop.  Your belly is swollen.  You feel like you may vomit (nauseous).  You vomit.  You have a fever.  You have belly pain that gets worse, and medicine does not help your pain. Summary  After the procedure, it is common to have a small amount of blood in your poop. You may also have mild cramping and bloating in your belly.  For the first 24 hours after the procedure, do not drive or use machinery, do not sign important documents, and do not drink alcohol.  Get help right away if you have a lot of blood in your poop, feel like you may vomit, have a fever, or have more belly pain. This information is not intended to replace advice given to you by your health care provider. Make sure you discuss any questions you have with your health care provider. Document Revised: 11/26/2018 Document Reviewed: 11/26/2018 Elsevier Patient Education  2020 [employer]  High-Fiber Diet Fiber, also called dietary fiber, is a type of  carbohydrate that is found in fruits, vegetables, whole grains, and beans. A high-fiber diet can have many health benefits. Your health care provider may recommend a high-fiber diet to help:  Prevent constipation. Fiber can make your bowel movements more regular.  Lower your cholesterol.  Relieve the following conditions: ? Swelling of veins in the anus (hemorrhoids). ? Swelling and irritation (inflammation) of specific areas of the digestive tract (uncomplicated diverticulosis). ? A problem of the large intestine (colon) that sometimes causes pain and diarrhea (irritable bowel syndrome, IBS).  Prevent  overeating as part of a weight-loss plan.  Prevent heart disease, type 2 diabetes, and certain cancers. What is my plan? The recommended daily fiber intake in grams (g) includes:  38 g for men age 64 or younger.  30 g for men over age 72.  60 g for women age 7 or younger.  21 g for women over age 3. You can get the recommended daily intake of dietary fiber by:  Eating a variety of fruits, vegetables, grains, and beans.  Taking a fiber supplement, if it is not possible to get enough fiber through your diet. What do I need to know about a high-fiber diet?  It is better to get fiber through food sources rather than from fiber supplements. There is not a lot of research about how effective supplements are.  Always check the fiber content on the nutrition facts label of any prepackaged food. Look for foods that contain 5 g of fiber or more per serving.  Talk with a diet and nutrition specialist (dietitian) if you have questions about specific foods that are recommended or not recommended for your medical condition, especially if those foods are not listed below.  Gradually increase how much fiber you consume. If you increase your intake of dietary fiber too quickly, you may have bloating, cramping, or gas.  Drink plenty of water. Water helps you to digest fiber. What are tips for following this plan?  Eat a wide variety of high-fiber foods.  Make sure that half of the grains that you eat each day are whole grains.  Eat breads and cereals that are made with whole-grain flour instead of refined flour or white flour.  Eat brown rice, bulgur wheat, or millet instead of white rice.  Start the day with a breakfast that is high in fiber, such as a cereal that contains 5 g of fiber or more per serving.  Use beans in place of meat in soups, salads, and pasta dishes.  Eat high-fiber snacks, such as berries, raw vegetables, nuts, and popcorn.  Choose whole fruits and vegetables instead  of processed forms like juice or sauce. What foods can I eat?  Fruits Berries. Pears. Apples. Oranges. Avocado. Prunes and raisins. Dried figs. Vegetables Sweet potatoes. Spinach. Kale. Artichokes. Cabbage. Broccoli. Cauliflower. Green peas. Carrots. Squash. Grains Whole-grain breads. Multigrain cereal. Oats and oatmeal. Brown rice. Barley. Bulgur wheat. [place]. Quinoa. Bran muffins. Popcorn. Rye wafer crackers. Meats and other proteins Navy, kidney, and pinto beans. Soybeans. Split peas. Lentils. Nuts and seeds. Dairy Fiber-fortified yogurt. Beverages Fiber-fortified soy milk. Fiber-fortified orange juice. Other foods Fiber bars. The items listed above may not be a complete list of recommended foods and beverages. Contact a dietitian for more options. What foods are not recommended? Fruits Fruit juice. Cooked, strained fruit. Vegetables Fried potatoes. Canned vegetables. Well-cooked vegetables. Grains White bread. Pasta made with refined flour. White rice. Meats and other proteins Fatty cuts of meat. Fried chicken or fried fish.  Dairy Milk. Yogurt. Cream cheese. Sour cream. Fats and oils Butters. Beverages Soft drinks. Other foods Cakes and pastries. The items listed above may not be a complete list of foods and beverages to avoid. Contact a dietitian for more information. Summary  Fiber is a type of carbohydrate. It is found in fruits, vegetables, whole grains, and beans.  There are many health benefits of eating a high-fiber diet, such as preventing constipation, lowering blood cholesterol, helping with weight loss, and reducing your risk of heart disease, diabetes, and certain cancers.  Gradually increase your intake of fiber. Increasing too fast can result in cramping, bloating, and gas. Drink plenty of water while you increase your fiber.  The best sources of fiber include whole fruits and vegetables, whole grains, nuts, seeds, and beans. This information is not  intended to replace advice given to you by your health care provider. Make sure you discuss any questions you have with your health care provider. Document Revised: 03/06/2017 Document Reviewed: 03/06/2017 Elsevier Patient Education  2020 [employer].

## 2020-05-13 NOTE — Transfer of Care (Signed)
Immediate Anesthesia Transfer of Care Note  Patient: Gina Whitaker  Procedure(s) Performed: COLONOSCOPY WITH PROPOFOL (N/A )  Patient Location: PACU  Anesthesia Type:General  Level of Consciousness: awake, oriented, drowsy and patient cooperative  Airway & Oxygen Therapy: Patient Spontanous Breathing  Post-op Assessment: Report given to RN, Post -op Vital signs reviewed and stable and Patient moving all extremities  Post vital signs: Reviewed and stable  Last Vitals:  Vitals Value Taken Time  BP    Temp    Pulse    Resp    SpO2      Last Pain:  Vitals:   05/13/20 0726  TempSrc:   PainSc: 0-No pain         Complications: No complications documented.

## 2020-05-14 ENCOUNTER — Encounter (INDEPENDENT_AMBULATORY_CARE_PROVIDER_SITE_OTHER): Payer: Self-pay | Admitting: *Deleted

## 2020-05-19 ENCOUNTER — Encounter (HOSPITAL_COMMUNITY): Payer: Self-pay | Admitting: Internal Medicine

## 2020-05-28 MED FILL — PROGESTERONE MICRONIZED 200: 200 | 90 days supply | Qty: 90 | Fill #1

## 2020-06-16 ENCOUNTER — Ambulatory Visit (INDEPENDENT_AMBULATORY_CARE_PROVIDER_SITE_OTHER): Payer: 59 | Admitting: Obstetrics & Gynecology

## 2020-06-16 ENCOUNTER — Other Ambulatory Visit: Payer: Self-pay

## 2020-06-16 ENCOUNTER — Encounter: Payer: Self-pay | Admitting: Obstetrics & Gynecology

## 2020-06-16 ENCOUNTER — Other Ambulatory Visit: Payer: Self-pay | Admitting: Obstetrics & Gynecology

## 2020-06-16 VITALS — BP 113/65 | HR 68 | Ht 65.0 in | Wt 195.0 lb

## 2020-06-16 DIAGNOSIS — B373 Candidiasis of vulva and vagina: Secondary | ICD-10-CM

## 2020-06-16 DIAGNOSIS — B3731 Acute candidiasis of vulva and vagina: Secondary | ICD-10-CM

## 2020-06-16 DIAGNOSIS — N3946 Mixed incontinence: Secondary | ICD-10-CM

## 2020-06-16 MED ORDER — FLUCONAZOLE 100 MG PO TABS: 100.0000 mg | ORAL_TABLET | Freq: Every day | ORAL | 0 refills | Status: DC

## 2020-06-16 MED ORDER — MIRABEGRON ER 50 MG PO TB24: 50.0000 mg | ORAL_TABLET | Freq: Every day | ORAL | 2 refills | Status: DC

## 2020-06-16 MED FILL — MYRBETRIQ ER 50 MG TABLET: 50 | 30 days supply | Qty: 30 | Fill #0

## 2020-06-16 MED FILL — FLUCONAZOLE 100 MG TABLET: 100 | 14 days supply | Qty: 14 | Fill #0

## 2020-06-16 NOTE — Progress Notes (Signed)
Chief Complaint  Patient presents with  . Vulvar Irritation      55 y.o. [id number] No LMP recorded. Patient has had an ablation. The current method of family planning is post menopausal status.  Outpatient Encounter Medications as of 06/16/2020  Medication Sig  . acyclovir (ZOVIRAX) 400 MG tablet Take 400 mg by mouth 5 (five) times daily as needed (fever blisters).  Marland Kitchen albuterol (VENTOLIN HFA) 108 (90 Base) MCG/ACT inhaler Inhale into the lungs every 4 (four) hours as needed for wheezing or shortness of breath.  . cetirizine (ZYRTEC) 10 MG tablet Take 10 mg by mouth daily.  . cyanocobalamin (,VITAMIN B-12,) 1000 MCG/ML injection Inject 1 mL (1,000 mcg total) into the muscle every 30 (thirty) days.  Marland Kitchen diltiazem (CARDIZEM CD) 180 MG 24 hr capsule Take 180 mg by mouth daily.  Marland Kitchen docusate sodium (COLACE) 100 MG capsule Take 100 mg by mouth daily.  . ergocalciferol (VITAMIN D2) 1.25 MG (50000 UT) capsule Take 1 capsule (50,000 Units total) by mouth once a week. (Patient taking differently: Take 50,000 Units by mouth every Thursday.)  . estradiol (VIVELLE-DOT) 0.1 MG/24HR patch Place 1 patch (0.1 mg total) onto the skin 2 (two) times a week. (Patient taking differently: Place 1 patch onto the skin 2 (two) times a week. Changes on Mondays and Thursdays)  . fluconazole (DIFLUCAN) 100 MG tablet Take 1 tablet (100 mg total) by mouth daily.  . fluticasone (FLONASE) 50 MCG/ACT nasal spray Place 2 sprays into both nostrils daily.  . mirabegron ER (MYRBETRIQ) 50 MG TB24 tablet Take 1 tablet (50 mg total) by mouth daily. At bedtime  . Multiple Vitamins-Minerals (ADULT GUMMY PO) Take 2 capsules by mouth daily.  . ondansetron (ZOFRAN) 4 MG tablet Take 1 tablet (4 mg total) by mouth every 6 (six) hours as needed for nausea or vomiting.  . progesterone (PROMETRIUM) 200 MG capsule Take 1 capsule (200 mg total) by mouth daily. nightly (Patient taking differently: Take 200 mg by mouth at bedtime.)  . psyllium  (METAMUCIL SMOOTH TEXTURE) 58.6 % powder Take 1 packet by mouth at bedtime.  . rizatriptan (MAXALT) 10 MG tablet Take 10 mg by mouth daily as needed for migraine.   No facility-administered encounter medications on file as of 06/16/2020.    Subjective Pt with 3 years or so of differering intensity, wears pads for UI, mixed, favor stress incontinence  On HRT which is making her menopausal symtpoms much much better, essentially resolved Past Medical History:  Diagnosis Date  . Asthma   . Diverticulitis   . Hypertension   . Sleep apnea     Past Surgical History:  Procedure Laterality Date  . APPENDECTOMY    . CHOLECYSTECTOMY    . COLONOSCOPY WITH PROPOFOL N/A 05/13/2020   Procedure: COLONOSCOPY WITH PROPOFOL;  Surgeon: Rogene Houston, MD;  Location: [facility];  Service: Endoscopy;  Laterality: N/A;  730  . endometrial ablation    . NECK SURGERY  2017   t lift and fixation  C4 C5 C6 C7   . TEMPOROMANDIBULAR JOINT SURGERY     twice bilateral    OB History    Gravida  3   Para  2   Term  1   Preterm  1   AB  1   Living  2     SAB  1   IAB      Ectopic      Multiple  Live Births  2           Allergies  Allergen Reactions  . Hydrocodone Rash and Swelling  . Latex Swelling and Rash  . Tramadol     Rash and facial swelling  . Erythromycin Nausea And Vomiting  . Watermelon [Citrullus Vulgaris]     Oral itch syndrome   . Codeine Itching and Rash  . Morphine Itching, Swelling and Rash  . Penicillins Rash    Social History   Socioeconomic History  . Marital status: Married    Spouse name: Not on file  . Number of children: 2  . Years of education: Not on file  . Highest education level: Not on file  Occupational History  . Not on file  Tobacco Use  . Smoking status: Former Smoker    Packs/day: 3.00    Years: 6.00    Pack years: 18.00    Types: Cigarettes    Quit date: 1994    Years since quitting: 28.1  . Smokeless tobacco: Never  Used  Vaping Use  . Vaping Use: Never used  Substance and Sexual Activity  . Alcohol use: Never  . Drug use: Never  . Sexual activity: Not Currently    Birth control/protection: Surgical    Comment: ablation  Other Topics Concern  . Not on file  Social History Narrative  . Not on file   Social Determinants of Health   Financial Resource Strain: Low Risk   . Difficulty of Paying Living Expenses: Not hard at all  Food Insecurity: No Food Insecurity  . Worried About Charity fundraiser in the Last Year: Never true  . Ran Out of Food in the Last Year: Never true  Transportation Needs: No Transportation Needs  . Lack of Transportation (Medical): No  . Lack of Transportation (Non-Medical): No  Physical Activity: Insufficiently Active  . Days of Exercise per Week: 2 days  . Minutes of Exercise per Session: 20 min  Stress: No Stress Concern Present  . Feeling of Stress : Only a little  Social Connections: Moderately Integrated  . Frequency of Communication with Friends and Family: Twice a week  . Frequency of Social Gatherings with Friends and Family: Once a week  . Attends Religious Services: More than 4 times per year  . Active Member of Clubs or Organizations: No  . Attends Archivist Meetings: Never  . Marital Status: Married    Family History  Problem Relation Age of Onset  . Congestive Heart Failure Paternal Grandfather   . Heart attack Paternal Grandfather   . Dementia Paternal Grandfather   . Basal cell carcinoma Paternal Grandfather   . Cancer Paternal Grandmother        pancreatic  . Aneurysm Maternal Grandmother   . Hypertension Maternal Grandmother   . CAD Maternal Grandmother   . Heart attack Maternal Grandfather   . Mental illness Maternal Grandfather   . Hypertension Father   . Basal cell carcinoma Father   . Cancer Father        prostate  . Glaucoma Father   . Hypertension Mother   . Melanoma Mother   . High Cholesterol Mother   . Irritable  bowel syndrome Mother   . Iron deficiency Mother   . Arthritis Mother   . High Cholesterol Brother   . Hypertension Brother   . Other Brother   . Asthma Daughter   . Polycystic ovary syndrome Daughter     Medications:  Current Outpatient Medications:  .  acyclovir (ZOVIRAX) 400 MG tablet, Take 400 mg by mouth 5 (five) times daily as needed (fever blisters)., Disp: , Rfl:  .  albuterol (VENTOLIN HFA) 108 (90 Base) MCG/ACT inhaler, Inhale into the lungs every 4 (four) hours as needed for wheezing or shortness of breath., Disp: , Rfl:  .  cetirizine (ZYRTEC) 10 MG tablet, Take 10 mg by mouth daily., Disp: , Rfl:  .  cyanocobalamin (,VITAMIN B-12,) 1000 MCG/ML injection, Inject 1 mL (1,000 mcg total) into the muscle every 30 (thirty) days., Disp: 10 mL, Rfl: 0 .  diltiazem (CARDIZEM CD) 180 MG 24 hr capsule, Take 180 mg by mouth daily., Disp: , Rfl:  .  docusate sodium (COLACE) 100 MG capsule, Take 100 mg by mouth daily., Disp: , Rfl:  .  ergocalciferol (VITAMIN D2) 1.25 MG (50000 UT) capsule, Take 1 capsule (50,000 Units total) by mouth once a week. (Patient taking differently: Take 50,000 Units by mouth every Thursday.), Disp: 10 capsule, Rfl: 0 .  estradiol (VIVELLE-DOT) 0.1 MG/24HR patch, Place 1 patch (0.1 mg total) onto the skin 2 (two) times a week. (Patient taking differently: Place 1 patch onto the skin 2 (two) times a week. Changes on Mondays and Thursdays), Disp: 24 patch, Rfl: 3 .  fluconazole (DIFLUCAN) 100 MG tablet, Take 1 tablet (100 mg total) by mouth daily., Disp: 14 tablet, Rfl: 0 .  fluticasone (FLONASE) 50 MCG/ACT nasal spray, Place 2 sprays into both nostrils daily., Disp: , Rfl:  .  mirabegron ER (MYRBETRIQ) 50 MG TB24 tablet, Take 1 tablet (50 mg total) by mouth daily. At bedtime, Disp: 30 tablet, Rfl: 2 .  Multiple Vitamins-Minerals (ADULT GUMMY PO), Take 2 capsules by mouth daily., Disp: , Rfl:  .  ondansetron (ZOFRAN) 4 MG tablet, Take 1 tablet (4 mg total) by  mouth every 6 (six) hours as needed for nausea or vomiting., Disp: 20 tablet, Rfl: 0 .  progesterone (PROMETRIUM) 200 MG capsule, Take 1 capsule (200 mg total) by mouth daily. nightly (Patient taking differently: Take 200 mg by mouth at bedtime.), Disp: 90 capsule, Rfl: 3 .  psyllium (METAMUCIL SMOOTH TEXTURE) 58.6 % powder, Take 1 packet by mouth at bedtime., Disp: , Rfl:  .  rizatriptan (MAXALT) 10 MG tablet, Take 10 mg by mouth daily as needed for migraine., Disp: , Rfl:   Objective Blood pressure 113/65, pulse 68, height 5\' 5"  (1.651 m), weight 195 lb (88.5 kg).  General WDWN female NAD Vulva:  Erythema of vulva, excoriations are present, intertriginous cracking of skin  Vagina:  normal mucosa, no discharge Cervix:  Normal no lesions Uterus:  normal size, contour, position, consistency, mobility, non-tender Adnexa: ovaries:present,  normal adnexa in size, nontender and no masses   Pertinent ROS Wears pads most days, not on week ends, loses urine 2-3 small volume  No burning with urination, frequency or urgency No nausea, vomiting or diarrhea Nor fever chills or other constitutional symptoms   Labs or studies     Impression Diagnoses this Encounter::   ICD-10-CM   1. Candidal vulvovaginitis  B37.3   2. Mixed incontinence, stress dominant  N39.46     Established relevant diagnosis(es):   Plan/Recommendations: Meds ordered this encounter  Medications  . fluconazole (DIFLUCAN) 100 MG tablet    Sig: Take 1 tablet (100 mg total) by mouth daily.    Dispense:  14 tablet    Refill:  0  . mirabegron ER (MYRBETRIQ) 50 MG TB24 tablet  Sig: Take 1 tablet (50 mg total) by mouth daily. At bedtime    Dispense:  30 tablet    Refill:  2    Labs or Scans Ordered: No orders of the defined types were placed in this encounter.   Management:: Gentian violet painting today Diflucan x 2 weeks Trial myrbetriq Follow up 2 weeks  Stop using all OTC remedies   Follow  up Return in about 2 weeks (around 06/30/2020) for Follow up, with Dr Elonda Husky.       All questions were answered.

## 2020-07-01 MED FILL — ACYCLOVIR 400 MG TABLET: 400 | 5 days supply | Qty: 25 | Fill #1

## 2020-07-01 MED FILL — CARTIA XT 180 MG CAPSULE SA: 180 | 90 days supply | Qty: 90 | Fill #2

## 2020-07-02 MED FILL — MYRBETRIQ ER 50 MG TABLET: 50 | 30 days supply | Qty: 30 | Fill #0

## 2020-07-03 ENCOUNTER — Encounter: Payer: Self-pay | Admitting: Obstetrics & Gynecology

## 2020-07-03 ENCOUNTER — Ambulatory Visit: Payer: 59 | Admitting: Obstetrics & Gynecology

## 2020-07-03 ENCOUNTER — Other Ambulatory Visit: Payer: Self-pay

## 2020-07-03 VITALS — BP 115/68 | HR 73 | Ht 65.0 in | Wt 196.0 lb

## 2020-07-03 DIAGNOSIS — B373 Candidiasis of vulva and vagina: Secondary | ICD-10-CM

## 2020-07-03 DIAGNOSIS — N3946 Mixed incontinence: Secondary | ICD-10-CM | POA: Diagnosis not present

## 2020-07-03 DIAGNOSIS — B3731 Acute candidiasis of vulva and vagina: Secondary | ICD-10-CM

## 2020-07-03 MED ORDER — GERHARDT'S BUTT CREAM: 1.0000 "application " | TOPICAL_CREAM | Freq: Two times a day (BID) | CUTANEOUS | 11 refills | Status: DC

## 2020-07-03 NOTE — Progress Notes (Signed)
Chief Complaint  Patient presents with  . Follow-up      56 y.o. [id number] who presents for follow up regarding: pad vulvitis resulting in moderate candidal vulvovaginitis   Outpatient Encounter Medications as of 07/03/2020  Medication Sig  . acyclovir (ZOVIRAX) 400 MG tablet Take 400 mg by mouth 5 (five) times daily as needed (fever blisters).  Marland Kitchen albuterol (VENTOLIN HFA) 108 (90 Base) MCG/ACT inhaler Inhale into the lungs every 4 (four) hours as needed for wheezing or shortness of breath.  . cetirizine (ZYRTEC) 10 MG tablet Take 10 mg by mouth daily.  . cyanocobalamin (,VITAMIN B-12,) 1000 MCG/ML injection Inject 1 mL (1,000 mcg total) into the muscle every 30 (thirty) days.  Marland Kitchen diltiazem (CARDIZEM CD) 180 MG 24 hr capsule Take 180 mg by mouth daily.  Marland Kitchen docusate sodium (COLACE) 100 MG capsule Take 100 mg by mouth daily.  . ergocalciferol (VITAMIN D2) 1.25 MG (50000 UT) capsule Take 1 capsule (50,000 Units total) by mouth once a week. (Patient taking differently: Take 50,000 Units by mouth every Thursday.)  . estradiol (VIVELLE-DOT) 0.1 MG/24HR patch Place 1 patch (0.1 mg total) onto the skin 2 (two) times a week. (Patient taking differently: Place 1 patch onto the skin 2 (two) times a week. Changes on Mondays and Thursdays)  . fluticasone (FLONASE) 50 MCG/ACT nasal spray Place 2 sprays into both nostrils daily.  . mirabegron ER (MYRBETRIQ) 50 MG TB24 tablet Take 1 tablet (50 mg total) by mouth daily. At bedtime  . Multiple Vitamins-Minerals (ADULT GUMMY PO) Take 2 capsules by mouth daily.  Marland Kitchen Nystatin (GERHARDT'S BUTT CREAM) CREA Apply 1 application topically 2 (two) times daily.  . ondansetron (ZOFRAN) 4 MG tablet Take 1 tablet (4 mg total) by mouth every 6 (six) hours as needed for nausea or vomiting.  . progesterone (PROMETRIUM) 200 MG capsule Take 1 capsule (200 mg total) by mouth daily. nightly (Patient taking differently: Take 200 mg by mouth at bedtime.)  . psyllium (METAMUCIL  SMOOTH TEXTURE) 58.6 % powder Take 1 packet by mouth at bedtime.  . rizatriptan (MAXALT) 10 MG tablet Take 10 mg by mouth daily as needed for migraine.  . fluconazole (DIFLUCAN) 100 MG tablet Take 1 tablet (100 mg total) by mouth daily.   No facility-administered encounter medications on file as of 07/03/2020.    Subjective Pt states she is significantly improved as far as the vulvar symtpoms are concerned She just got the myrbetriq last week so no real change as of yet Past Medical History:  Diagnosis Date  . Asthma   . Diverticulitis   . Hypertension   . Sleep apnea     Past Surgical History:  Procedure Laterality Date  . APPENDECTOMY    . CHOLECYSTECTOMY    . COLONOSCOPY WITH PROPOFOL N/A 05/13/2020   Procedure: COLONOSCOPY WITH PROPOFOL;  Surgeon: Rogene Houston, MD;  Location: [facility];  Service: Endoscopy;  Laterality: N/A;  730  . endometrial ablation    . NECK SURGERY  2017   t lift and fixation  C4 C5 C6 C7   . TEMPOROMANDIBULAR JOINT SURGERY     twice bilateral    OB History    Gravida  3   Para  2   Term  1   Preterm  1   AB  1   Living  2     SAB  1   IAB      Ectopic  Multiple      Live Births  2           Allergies  Allergen Reactions  . Hydrocodone Rash and Swelling  . Latex Swelling and Rash  . Tramadol     Rash and facial swelling  . Erythromycin Nausea And Vomiting  . Watermelon [Citrullus Vulgaris]     Oral itch syndrome   . Codeine Itching and Rash  . Morphine Itching, Swelling and Rash  . Penicillins Rash    Social History   Socioeconomic History  . Marital status: Married    Spouse name: Not on file  . Number of children: 2  . Years of education: Not on file  . Highest education level: Not on file  Occupational History  . Not on file  Tobacco Use  . Smoking status: Former Smoker    Packs/day: 3.00    Years: 6.00    Pack years: 18.00    Types: Cigarettes    Quit date: 1994    Years since  quitting: 28.1  . Smokeless tobacco: Never Used  Vaping Use  . Vaping Use: Never used  Substance and Sexual Activity  . Alcohol use: Never  . Drug use: Never  . Sexual activity: Not Currently    Birth control/protection: Surgical    Comment: ablation  Other Topics Concern  . Not on file  Social History Narrative  . Not on file   Social Determinants of Health   Financial Resource Strain: Low Risk   . Difficulty of Paying Living Expenses: Not hard at all  Food Insecurity: No Food Insecurity  . Worried About Charity fundraiser in the Last Year: Never true  . Ran Out of Food in the Last Year: Never true  Transportation Needs: No Transportation Needs  . Lack of Transportation (Medical): No  . Lack of Transportation (Non-Medical): No  Physical Activity: Insufficiently Active  . Days of Exercise per Week: 2 days  . Minutes of Exercise per Session: 20 min  Stress: No Stress Concern Present  . Feeling of Stress : Only a little  Social Connections: Moderately Integrated  . Frequency of Communication with Friends and Family: Twice a week  . Frequency of Social Gatherings with Friends and Family: Once a week  . Attends Religious Services: More than 4 times per year  . Active Member of Clubs or Organizations: No  . Attends Archivist Meetings: Never  . Marital Status: Married    Family History  Problem Relation Age of Onset  . Congestive Heart Failure Paternal Grandfather   . Heart attack Paternal Grandfather   . Dementia Paternal Grandfather   . Basal cell carcinoma Paternal Grandfather   . Cancer Paternal Grandmother        pancreatic  . Aneurysm Maternal Grandmother   . Hypertension Maternal Grandmother   . CAD Maternal Grandmother   . Heart attack Maternal Grandfather   . Mental illness Maternal Grandfather   . Hypertension Father   . Basal cell carcinoma Father   . Cancer Father        prostate  . Glaucoma Father   . Hypertension Mother   . Melanoma Mother    . High Cholesterol Mother   . Irritable bowel syndrome Mother   . Iron deficiency Mother   . Arthritis Mother   . High Cholesterol Brother   . Hypertension Brother   . Other Brother   . Asthma Daughter   . Polycystic ovary syndrome Daughter  Medications:       Current Outpatient Medications:  .  acyclovir (ZOVIRAX) 400 MG tablet, Take 400 mg by mouth 5 (five) times daily as needed (fever blisters)., Disp: , Rfl:  .  albuterol (VENTOLIN HFA) 108 (90 Base) MCG/ACT inhaler, Inhale into the lungs every 4 (four) hours as needed for wheezing or shortness of breath., Disp: , Rfl:  .  cetirizine (ZYRTEC) 10 MG tablet, Take 10 mg by mouth daily., Disp: , Rfl:  .  cyanocobalamin (,VITAMIN B-12,) 1000 MCG/ML injection, Inject 1 mL (1,000 mcg total) into the muscle every 30 (thirty) days., Disp: 10 mL, Rfl: 0 .  diltiazem (CARDIZEM CD) 180 MG 24 hr capsule, Take 180 mg by mouth daily., Disp: , Rfl:  .  docusate sodium (COLACE) 100 MG capsule, Take 100 mg by mouth daily., Disp: , Rfl:  .  ergocalciferol (VITAMIN D2) 1.25 MG (50000 UT) capsule, Take 1 capsule (50,000 Units total) by mouth once a week. (Patient taking differently: Take 50,000 Units by mouth every Thursday.), Disp: 10 capsule, Rfl: 0 .  estradiol (VIVELLE-DOT) 0.1 MG/24HR patch, Place 1 patch (0.1 mg total) onto the skin 2 (two) times a week. (Patient taking differently: Place 1 patch onto the skin 2 (two) times a week. Changes on Mondays and Thursdays), Disp: 24 patch, Rfl: 3 .  fluticasone (FLONASE) 50 MCG/ACT nasal spray, Place 2 sprays into both nostrils daily., Disp: , Rfl:  .  mirabegron ER (MYRBETRIQ) 50 MG TB24 tablet, Take 1 tablet (50 mg total) by mouth daily. At bedtime, Disp: 30 tablet, Rfl: 2 .  Multiple Vitamins-Minerals (ADULT GUMMY PO), Take 2 capsules by mouth daily., Disp: , Rfl:  .  Nystatin (GERHARDT'S BUTT CREAM) CREA, Apply 1 application topically 2 (two) times daily., Disp: 1 each, Rfl: 11 .  ondansetron  (ZOFRAN) 4 MG tablet, Take 1 tablet (4 mg total) by mouth every 6 (six) hours as needed for nausea or vomiting., Disp: 20 tablet, Rfl: 0 .  progesterone (PROMETRIUM) 200 MG capsule, Take 1 capsule (200 mg total) by mouth daily. nightly (Patient taking differently: Take 200 mg by mouth at bedtime.), Disp: 90 capsule, Rfl: 3 .  psyllium (METAMUCIL SMOOTH TEXTURE) 58.6 % powder, Take 1 packet by mouth at bedtime., Disp: , Rfl:  .  rizatriptan (MAXALT) 10 MG tablet, Take 10 mg by mouth daily as needed for migraine., Disp: , Rfl:  .  fluconazole (DIFLUCAN) 100 MG tablet, Take 1 tablet (100 mg total) by mouth daily., Disp: 14 tablet, Rfl: 0  Objective Blood pressure 115/68, pulse 73, height 5\' 5"  (1.651 m), weight 196 lb (88.9 kg).  General WDWN female NAD Vulva:  normal appearing vulva with no masses, tenderness or lesions Vagina:  normal mucosa, no discharge Cervix:  Normal no lesions    Pertinent ROS No burning with urination, frequency or urgency No nausea, vomiting or diarrhea Nor fever chills or other constitutional symptoms   Labs or studies No new    Impression Diagnoses this Encounter::   ICD-10-CM   1. Candidal vulvovaginitis, pad related  B37.3   2. Mixed incontinence, stress dominant  N39.46     Established relevant diagnosis(es):   Plan/Recommendations: Meds ordered this encounter  Medications  . Nystatin (GERHARDT'S BUTT CREAM) CREA    Sig: Apply 1 application topically 2 (two) times daily.    Dispense:  1 each    Refill:  11    Labs or Scans Ordered: No orders of the defined types were placed in  this encounter.   Management:: Continue Myrbetriq Start topical zinc oxide/nystatin/steroid cream--->Fanny cream  Follow up Return in about 6 weeks (around 08/14/2020) for MyChart Connect visit, Follow up, with Dr Elonda Husky.       All questions were answered.

## 2020-07-17 ENCOUNTER — Other Ambulatory Visit (HOSPITAL_COMMUNITY): Payer: Self-pay

## 2020-07-17 DIAGNOSIS — D509 Iron deficiency anemia, unspecified: Secondary | ICD-10-CM

## 2020-07-20 ENCOUNTER — Inpatient Hospital Stay (HOSPITAL_COMMUNITY): Payer: 59 | Attending: Hematology

## 2020-07-20 ENCOUNTER — Other Ambulatory Visit: Payer: Self-pay

## 2020-07-20 DIAGNOSIS — Z808 Family history of malignant neoplasm of other organs or systems: Secondary | ICD-10-CM | POA: Diagnosis not present

## 2020-07-20 DIAGNOSIS — Z638 Other specified problems related to primary support group: Secondary | ICD-10-CM | POA: Insufficient documentation

## 2020-07-20 DIAGNOSIS — G4730 Sleep apnea, unspecified: Secondary | ICD-10-CM | POA: Insufficient documentation

## 2020-07-20 DIAGNOSIS — Z79899 Other long term (current) drug therapy: Secondary | ICD-10-CM | POA: Diagnosis not present

## 2020-07-20 DIAGNOSIS — E559 Vitamin D deficiency, unspecified: Secondary | ICD-10-CM | POA: Insufficient documentation

## 2020-07-20 DIAGNOSIS — Z87891 Personal history of nicotine dependence: Secondary | ICD-10-CM | POA: Insufficient documentation

## 2020-07-20 DIAGNOSIS — R5383 Other fatigue: Secondary | ICD-10-CM | POA: Insufficient documentation

## 2020-07-20 DIAGNOSIS — D509 Iron deficiency anemia, unspecified: Secondary | ICD-10-CM | POA: Insufficient documentation

## 2020-07-20 DIAGNOSIS — I10 Essential (primary) hypertension: Secondary | ICD-10-CM | POA: Diagnosis not present

## 2020-07-20 DIAGNOSIS — J45909 Unspecified asthma, uncomplicated: Secondary | ICD-10-CM | POA: Insufficient documentation

## 2020-07-20 LAB — COMPREHENSIVE METABOLIC PANEL WITH GFR
ALT: 17 U/L (ref 0–44)
AST: 14 U/L — ABNORMAL LOW (ref 15–41)
Albumin: 4.1 g/dL (ref 3.5–5.0)
Alkaline Phosphatase: 58 U/L (ref 38–126)
Anion gap: 8 (ref 5–15)
BUN: 15 mg/dL (ref 6–20)
CO2: 24 mmol/L (ref 22–32)
Calcium: 8.8 mg/dL — ABNORMAL LOW (ref 8.9–10.3)
Chloride: 105 mmol/L (ref 98–111)
Creatinine, Ser: 0.82 mg/dL (ref 0.44–1.00)
GFR, Estimated: >60 mL/min
Glucose, Bld: 106 mg/dL — ABNORMAL HIGH (ref 70–99)
Potassium: 3.8 mmol/L (ref 3.5–5.1)
Sodium: 137 mmol/L (ref 135–145)
Total Bilirubin: 0.4 mg/dL (ref 0.3–1.2)
Total Protein: 6.8 g/dL (ref 6.5–8.1)

## 2020-07-20 LAB — CBC WITH DIFFERENTIAL/PLATELET
Abs Immature Granulocytes: 0.02 10*3/uL (ref 0.00–0.07)
Basophils Absolute: 0.1 10*3/uL (ref 0.0–0.1)
Basophils Relative: 1 %
Eosinophils Absolute: 0.2 10*3/uL (ref 0.0–0.5)
Eosinophils Relative: 4 %
HCT: 40.5 % (ref 36.0–46.0)
Hemoglobin: 13.4 g/dL (ref 12.0–15.0)
Immature Granulocytes: 0 %
Lymphocytes Relative: 33 %
Lymphs Abs: 2.1 10*3/uL (ref 0.7–4.0)
MCH: 30 pg (ref 26.0–34.0)
MCHC: 33.1 g/dL (ref 30.0–36.0)
MCV: 90.8 fL (ref 80.0–100.0)
Monocytes Absolute: 0.5 10*3/uL (ref 0.1–1.0)
Monocytes Relative: 8 %
Neutro Abs: 3.4 10*3/uL (ref 1.7–7.7)
Neutrophils Relative %: 54 %
Platelets: 253 10*3/uL (ref 150–400)
RBC: 4.46 MIL/uL (ref 3.87–5.11)
RDW: 12.8 % (ref 11.5–15.5)
WBC: 6.3 10*3/uL (ref 4.0–10.5)
nRBC: 0 % (ref 0.0–0.2)

## 2020-07-20 LAB — IRON AND TIBC
Iron: 71 ug/dL (ref 28–170)
Saturation Ratios: 21 % (ref 10.4–31.8)
TIBC: 344 ug/dL (ref 250–450)
UIBC: 273 ug/dL

## 2020-07-20 LAB — VITAMIN B12: Vitamin B-12: 333 pg/mL (ref 180–914)

## 2020-07-20 LAB — FERRITIN: Ferritin: 137 ng/mL (ref 11–307)

## 2020-07-20 LAB — VITAMIN D 25 HYDROXY (VIT D DEFICIENCY, FRACTURES): Vit D, 25-Hydroxy: 54.57 ng/mL (ref 30–100)

## 2020-07-27 ENCOUNTER — Ambulatory Visit (HOSPITAL_COMMUNITY): Payer: 59 | Admitting: Hematology

## 2020-08-07 ENCOUNTER — Encounter (HOSPITAL_COMMUNITY): Payer: Self-pay | Admitting: Hematology and Oncology

## 2020-08-07 ENCOUNTER — Other Ambulatory Visit: Payer: Self-pay

## 2020-08-07 ENCOUNTER — Inpatient Hospital Stay (HOSPITAL_COMMUNITY): Payer: 59 | Admitting: Hematology and Oncology

## 2020-08-07 VITALS — BP 122/62 | HR 65 | Temp 98.4°F | Resp 16 | Wt 196.4 lb

## 2020-08-07 DIAGNOSIS — G4730 Sleep apnea, unspecified: Secondary | ICD-10-CM | POA: Diagnosis not present

## 2020-08-07 DIAGNOSIS — Z638 Other specified problems related to primary support group: Secondary | ICD-10-CM | POA: Diagnosis not present

## 2020-08-07 DIAGNOSIS — Z808 Family history of malignant neoplasm of other organs or systems: Secondary | ICD-10-CM | POA: Diagnosis not present

## 2020-08-07 DIAGNOSIS — Z87891 Personal history of nicotine dependence: Secondary | ICD-10-CM | POA: Diagnosis not present

## 2020-08-07 DIAGNOSIS — R5383 Other fatigue: Secondary | ICD-10-CM | POA: Diagnosis not present

## 2020-08-07 DIAGNOSIS — E559 Vitamin D deficiency, unspecified: Secondary | ICD-10-CM | POA: Diagnosis not present

## 2020-08-07 DIAGNOSIS — J45909 Unspecified asthma, uncomplicated: Secondary | ICD-10-CM | POA: Diagnosis not present

## 2020-08-07 DIAGNOSIS — D509 Iron deficiency anemia, unspecified: Secondary | ICD-10-CM | POA: Diagnosis not present

## 2020-08-07 DIAGNOSIS — I10 Essential (primary) hypertension: Secondary | ICD-10-CM | POA: Diagnosis not present

## 2020-08-07 DIAGNOSIS — E538 Deficiency of other specified B group vitamins: Secondary | ICD-10-CM

## 2020-08-07 NOTE — Progress Notes (Signed)
CONSULT NOTE  Patient Care Team: [name], MD as PCP - General (Internal Medicine)  CHIEF COMPLAINTS/PURPOSE OF CONSULTATION: Iron deficiency anemia  HISTORY OF PRESENTING ILLNESS:   Gina Whitaker 55 y.o. female was sent here by her PCP for iron deficiency anemia.   She had her last iron infusion in October 2021.  Interval history Patient just complains of feeling tired given some family stressors.  Otherwise she feels quite energetic.  She does not have any symptoms concerning for relapse iron deficiency.  She does not mention of any pica.  She has menstrual cycles once every 4 to 5 months. No change in bowel habits, hematochezia or melena. She is not currently taking any oral iron supplementation. No change in urinary habits or new neurological complaints. She had colonoscopy December and this demonstrated some diverticula. Rest of the pertinent 10 point ROS reviewed and negative.   MEDICAL HISTORY:  Past Medical History:  Diagnosis Date  . Asthma   . Diverticulitis   . Hypertension   . Sleep apnea     SURGICAL HISTORY: Past Surgical History:  Procedure Laterality Date  . APPENDECTOMY    . CHOLECYSTECTOMY    . COLONOSCOPY WITH PROPOFOL N/A 05/13/2020   Procedure: COLONOSCOPY WITH PROPOFOL;  Surgeon: Rogene Houston, MD;  [facility];  Service: Endoscopy;  Laterality: N/A;  730  . endometrial ablation    . NECK SURGERY  2017   t lift and fixation  C4 C5 C6 C7   . TEMPOROMANDIBULAR JOINT SURGERY     twice bilateral    SOCIAL HISTORY: Social History   Socioeconomic History  . Marital status: Married    Spouse name: Not on file  . Number of children: 2  . Years of education: Not on file  . Highest education level: Not on file  Occupational History  . Not on file  Tobacco Use  . Smoking status: Former Smoker    Packs/day: 3.00    Years: 6.00    Pack years: 18.00    Types: Cigarettes    Quit date: 1994    Years since quitting: 28.2  .  Smokeless tobacco: Never Used  Vaping Use  . Vaping Use: Never used  Substance and Sexual Activity  . Alcohol use: Never  . Drug use: Never  . Sexual activity: Not Currently    Birth control/protection: Surgical    Comment: ablation  Other Topics Concern  . Not on file  Social History Narrative  . Not on file   Social Determinants of Health   Financial Resource Strain: Low Risk   . Difficulty of Paying Living Expenses: Not hard at all  Food Insecurity: No Food Insecurity  . Worried About Charity fundraiser in the Last Year: Never true  . Ran Out of Food in the Last Year: Never true  Transportation Needs: No Transportation Needs  . Lack of Transportation (Medical): No  . Lack of Transportation (Non-Medical): No  Physical Activity: Insufficiently Active  . Days of Exercise per Week: 2 days  . Minutes of Exercise per Session: 20 min  Stress: No Stress Concern Present  . Feeling of Stress : Only a little  Social Connections: Moderately Integrated  . Frequency of Communication with Friends and Family: Twice a week  . Frequency of Social Gatherings with Friends and Family: Once a week  . Attends Religious Services: More than 4 times per year  . Active Member of Clubs or Organizations: No  . Attends  Club or Organization Meetings: Never  . Marital Status: Married  Human resources officer Violence: Not At Risk  . Fear of Current or Ex-Partner: No  . Emotionally Abused: No  . Physically Abused: No  . Sexually Abused: No    FAMILY HISTORY: Family History  Problem Relation Age of Onset  . Congestive Heart Failure Paternal Grandfather   . Heart attack Paternal Grandfather   . Dementia Paternal Grandfather   . Basal cell carcinoma Paternal Grandfather   . Cancer Paternal Grandmother        pancreatic  . Aneurysm Maternal Grandmother   . Hypertension Maternal Grandmother   . CAD Maternal Grandmother   . Heart attack Maternal Grandfather   . Mental illness Maternal Grandfather   .  Hypertension Father   . Basal cell carcinoma Father   . Cancer Father        prostate  . Glaucoma Father   . Hypertension Mother   . Melanoma Mother   . High Cholesterol Mother   . Irritable bowel syndrome Mother   . Iron deficiency Mother   . Arthritis Mother   . High Cholesterol Brother   . Hypertension Brother   . Other Brother   . Asthma Daughter   . Polycystic ovary syndrome Daughter     ALLERGIES:  is allergic to hydrocodone, latex, tramadol, erythromycin, watermelon [citrullus vulgaris], codeine, morphine, and penicillins.  MEDICATIONS:  Current Outpatient Medications  Medication Sig Dispense Refill  . acyclovir (ZOVIRAX) 400 MG tablet Take 400 mg by mouth 5 (five) times daily as needed (fever blisters).    Marland Kitchen albuterol (VENTOLIN HFA) 108 (90 Base) MCG/ACT inhaler Inhale into the lungs every 4 (four) hours as needed for wheezing or shortness of breath.    . cetirizine (ZYRTEC) 10 MG tablet Take 10 mg by mouth daily.    . cyanocobalamin (,VITAMIN B-12,) 1000 MCG/ML injection Inject 1 mL (1,000 mcg total) into the muscle every 30 (thirty) days. 10 mL 0  . diltiazem (CARDIZEM CD) 180 MG 24 hr capsule Take 180 mg by mouth daily.    Marland Kitchen docusate sodium (COLACE) 100 MG capsule Take 100 mg by mouth daily.    . ergocalciferol (VITAMIN D2) 1.25 MG (50000 UT) capsule Take 1 capsule (50,000 Units total) by mouth once a week. (Patient taking differently: Take 50,000 Units by mouth every Thursday.) 10 capsule 0  . estradiol (VIVELLE-DOT) 0.1 MG/24HR patch Place 1 patch (0.1 mg total) onto the skin 2 (two) times a week. (Patient taking differently: Place 1 patch onto the skin 2 (two) times a week. Changes on Mondays and Thursdays) 24 patch 3  . fluconazole (DIFLUCAN) 100 MG tablet Take 1 tablet (100 mg total) by mouth daily. 14 tablet 0  . fluticasone (FLONASE) 50 MCG/ACT nasal spray Place 2 sprays into both nostrils daily.    . Multiple Vitamins-Minerals (ADULT GUMMY PO) Take 2 capsules by  mouth daily.    Marland Kitchen Nystatin (GERHARDT'S BUTT CREAM) CREA Apply 1 application topically 2 (two) times daily. 1 each 11  . progesterone (PROMETRIUM) 200 MG capsule Take 1 capsule (200 mg total) by mouth daily. nightly (Patient taking differently: Take 200 mg by mouth at bedtime.) 90 capsule 3  . psyllium (METAMUCIL SMOOTH TEXTURE) 58.6 % powder Take 1 packet by mouth at bedtime.    . rizatriptan (MAXALT) 10 MG tablet Take 10 mg by mouth daily as needed for migraine.    . mirabegron ER (MYRBETRIQ) 50 MG TB24 tablet Take 1 tablet (50 mg  total) by mouth daily. At bedtime (Patient not taking: Reported on 08/07/2020) 30 tablet 2  . ondansetron (ZOFRAN) 4 MG tablet Take 1 tablet (4 mg total) by mouth every 6 (six) hours as needed for nausea or vomiting. (Patient not taking: Reported on 08/07/2020) 20 tablet 0   No current facility-administered medications for this visit.    REVIEW OF SYSTEMS:   Review of Systems  Constitutional: Positive for malaise/fatigue. Negative for chills, fever and weight loss.  HENT: Negative for congestion, ear pain and tinnitus.   Eyes: Negative.  Negative for blurred vision and double vision.  Respiratory: Negative.  Negative for cough, sputum production and shortness of breath.   Cardiovascular: Negative.  Negative for chest pain, palpitations and leg swelling.  Gastrointestinal: Negative.  Negative for abdominal pain, constipation, diarrhea, nausea and vomiting.  Genitourinary: Negative for dysuria, frequency and urgency.  Musculoskeletal: Positive for back pain and neck pain. Negative for falls.  Skin: Negative.  Negative for rash.  Neurological: Negative.  Negative for weakness and headaches.  Endo/Heme/Allergies: Negative.  Does not bruise/bleed easily.  Psychiatric/Behavioral: Negative.  Negative for depression. The patient is not nervous/anxious and does not have insomnia.    PHYSICAL EXAMINATION: ECOG PERFORMANCE STATUS: 1 - Symptomatic but completely  ambulatory  Vitals:   08/07/20 0845  BP: 122/62  Pulse: 65  Resp: 16  Temp: 98.4 F (36.9 C)  SpO2: 98%   Filed Weights   08/07/20 0845  Weight: 196 lb 6.4 oz (89.1 kg)    Physical Exam Constitutional:      Appearance: Normal appearance.  HENT:     Head: Normocephalic and atraumatic.  Eyes:     Pupils: Pupils are equal, round, and reactive to light.  Cardiovascular:     Rate and Rhythm: Normal rate and regular rhythm.     Heart sounds: Normal heart sounds. No murmur heard.   Pulmonary:     Effort: Pulmonary effort is normal.     Breath sounds: Normal breath sounds. No wheezing.  Abdominal:     General: Bowel sounds are normal. There is no distension.     Palpations: Abdomen is soft.     Tenderness: There is no abdominal tenderness.  Musculoskeletal:        General: Normal range of motion.     Cervical back: Normal range of motion.  Skin:    General: Skin is warm and dry.     Findings: No rash.  Neurological:     Mental Status: She is alert and oriented to person, place, and time.  Psychiatric:        Judgment: Judgment normal.     LABORATORY DATA:  I have reviewed the data as listed Recent Results (from the past 2160 hour(s))  Pregnancy, urine     Status: None   Collection Time: 05/12/20  8:18 AM  Result Value Ref Range   Preg Test, Ur NEGATIVE NEGATIVE    Comment:        THE SENSITIVITY OF THIS METHODOLOGY IS >20 mIU/mL. Performed at [facility], [address] [place], [place] [address]   SARS CORONAVIRUS 2 (TAT 6-24 HRS) Nasopharyngeal Nasopharyngeal Swab     Status: None   Collection Time: 05/12/20 11:54 AM   Specimen: Nasopharyngeal Swab  Result Value Ref Range   SARS Coronavirus 2 NEGATIVE NEGATIVE    Comment: (NOTE) SARS-CoV-2 target nucleic acids are NOT DETECTED.  The SARS-CoV-2 RNA is generally detectable in upper and lower respiratory specimens during the acute phase  of infection. Negative results do not preclude SARS-CoV-2 infection,  do not rule out co-infections with other pathogens, and should not be used as the sole basis for treatment or other patient management decisions. Negative results must be combined with clinical observations, patient history, and epidemiological information. The expected result is Negative.  Fact Sheet for Patients: SugarRoll.be  Fact Sheet for Healthcare Providers: https://www.woods-mathews.com/  This test is not yet approved or cleared by the [place] FDA and  has been authorized for detection and/or diagnosis of SARS-CoV-2 by FDA under an Emergency Use Authorization (EUA). This EUA will remain  in effect (meaning this test can be used) for the duration of the COVID-19 declaration under Se ction 564(b)(1) of the Act, 21 U.S.C. section 360bbb-3(b)(1), unless the authorization is terminated or revoked sooner.  Performed at [facility], [place] [address], [place], [place] [address]   HM COLONOSCOPY     Status: None   Collection Time: 05/13/20 12:00 AM  Result Value Ref Range   HM Colonoscopy See Report (in chart) See Report (in chart), Patient Reported  CBC with Differential/Platelet     Status: None   Collection Time: 07/20/20  2:03 PM  Result Value Ref Range   WBC 6.3 4.0 - 10.5 K/uL   RBC 4.46 3.87 - 5.11 MIL/uL   Hemoglobin 13.4 12.0 - 15.0 g/dL   HCT 40.5 36.0 - 46.0 %   MCV 90.8 80.0 - 100.0 fL   MCH 30.0 26.0 - 34.0 pg   MCHC 33.1 30.0 - 36.0 g/dL   RDW 12.8 11.5 - 15.5 %   Platelets 253 150 - 400 K/uL   nRBC 0.0 0.0 - 0.2 %   Neutrophils Relative % 54 %   Neutro Abs 3.4 1.7 - 7.7 K/uL   Lymphocytes Relative 33 %   Lymphs Abs 2.1 0.7 - 4.0 K/uL   Monocytes Relative 8 %   Monocytes Absolute 0.5 0.1 - 1.0 K/uL   Eosinophils Relative 4 %   Eosinophils Absolute 0.2 0.0 - 0.5 K/uL   Basophils Relative 1 %   Basophils Absolute 0.1 0.0 - 0.1 K/uL   Immature Granulocytes 0 %   Abs Immature Granulocytes 0.02 0.00 - 0.07  K/uL    Comment: Performed at [facility], [address] [place], [place] [address]  Comprehensive metabolic panel     Status: Abnormal   Collection Time: 07/20/20  2:03 PM  Result Value Ref Range   Sodium 137 135 - 145 mmol/L   Potassium 3.8 3.5 - 5.1 mmol/L   Chloride 105 98 - 111 mmol/L   CO2 24 22 - 32 mmol/L   Glucose, Bld 106 (H) 70 - 99 mg/dL    Comment: Glucose reference range applies only to samples taken after fasting for at least 8 hours.   BUN 15 6 - 20 mg/dL   Creatinine, Ser 0.82 0.44 - 1.00 mg/dL   Calcium 8.8 (L) 8.9 - 10.3 mg/dL   Total Protein 6.8 6.5 - 8.1 g/dL   Albumin 4.1 3.5 - 5.0 g/dL   AST 14 (L) 15 - 41 U/L   ALT 17 0 - 44 U/L   Alkaline Phosphatase 58 38 - 126 U/L   Total Bilirubin 0.4 0.3 - 1.2 mg/dL   GFR, Estimated >60 >60 mL/min    Comment: (NOTE) Calculated using the CKD-EPI Creatinine Equation (2021)    Anion gap 8 5 - 15    Comment: Performed at [facility], [address],  [place], [place] [address]  Vitamin B12     Status: None   Collection Time: 07/20/20  2:03 PM  Result Value Ref Range   Vitamin B-12 333 180 - 914 pg/mL    Comment: (NOTE) This assay is not validated for testing neonatal or myeloproliferative syndrome specimens for Vitamin B12 levels. Performed at [facility], [address], [place], [place] [address]   VITAMIN D 25 Hydroxy (Vit-D Deficiency, Fractures)     Status: None   Collection Time: 07/20/20  2:03 PM  Result Value Ref Range   Vit D, 25-Hydroxy 54.57 30 - 100 ng/mL    Comment: (NOTE) Vitamin D deficiency has been defined by the [place] practice guideline as a level of serum 25-OH  vitamin D less than 20 ng/mL (1,2). The Endocrine Society went on to  further define vitamin D insufficiency as a level between 21 and 29  ng/mL (2).  1. IOM (Institute of Medicine). 2010. Dietary reference intakes for  calcium and D. [place]: The [employer]. 2. Holick MF,  Binkley [place], Bischoff-Ferrari HA, et al. Evaluation,  treatment, and prevention of vitamin D deficiency: an Endocrine  Society clinical practice guideline, JCEM. 2011 Jul; 96(7): 1911-30.  Performed at [facility], [place] [address], [place], [place] [address]   Iron and TIBC     Status: None   Collection Time: 07/20/20  2:03 PM  Result Value Ref Range   Iron 71 28 - 170 ug/dL   TIBC 344 250 - 450 ug/dL   Saturation Ratios 21 10.4 - 31.8 %   UIBC 273 ug/dL    Comment: Performed at [facility], [address] [place], [place] [address]  Ferritin     Status: None   Collection Time: 07/20/20  2:03 PM  Result Value Ref Range   Ferritin 137 11 - 307 ng/mL    Comment: Performed at [facility], [address], [place], [place] [address]    RADIOGRAPHIC STUDIES: I have personally reviewed the radiological images as listed and agreed with the findings in the report. I have independently interviewed this patient and agree with HPI written by my nurse practitioner Wenda Low, FNP. I have independently formulated my assessment and plan. ASSESSMENT & PLAN:  1. Normocytic anemia and iron deficiency state: -Labs from March 7 showed a hemoglobin of 13.4 and essentially normal CBC.  Iron panel and ferritin are completely normal, ferritin of 137 Review of systems unremarkable, physical examination unremarkable -Plan to recheck lab work in 3 months (CBC, CMP, vitamin B12, vitamin D and iron levels, ferritin).  2.  Vitamin D deficiency, most recent vitamin D level of 54 shot, she has completed 50,000 international units weekly for 8 to 12 weeks, recommended over-the-counter vitamin D 1000 international units daily.  #3.  Vitamin B12 deficiency, resolved.  She is up-to-date with age-appropriate cancer screening I spent 30 minutes in the care of the patient including history, physical exam, review of old records, counseling and coordination of care. All questions were answered. The patient  knows to call the clinic with any problems, questions or concerns.   Benay Pike, MD 08/07/20 9:02 AM

## 2020-08-14 ENCOUNTER — Encounter: Payer: Self-pay | Admitting: Obstetrics & Gynecology

## 2020-08-14 ENCOUNTER — Telehealth (INDEPENDENT_AMBULATORY_CARE_PROVIDER_SITE_OTHER): Payer: 59 | Admitting: Obstetrics & Gynecology

## 2020-08-14 ENCOUNTER — Other Ambulatory Visit: Payer: Self-pay

## 2020-08-14 DIAGNOSIS — N3946 Mixed incontinence: Secondary | ICD-10-CM | POA: Diagnosis not present

## 2020-08-14 DIAGNOSIS — B3731 Acute candidiasis of vulva and vagina: Secondary | ICD-10-CM

## 2020-08-14 DIAGNOSIS — B373 Candidiasis of vulva and vagina: Secondary | ICD-10-CM | POA: Diagnosis not present

## 2020-08-14 MED FILL — ESTRADIOL 0.1 MG PATCH: 0.1 | 84 days supply | Qty: 24 | Fill #1

## 2020-08-14 MED FILL — PROGESTERONE MICRONIZED 200: 200 | 90 days supply | Qty: 90 | Fill #2

## 2020-08-14 NOTE — Progress Notes (Signed)
   MyChart video HEALTH VIRTUAL GYNECOLOGY VISIT ENCOUNTER NOTE  The patient was at home and I am in my office here at [facility]  I connected with [name] on 08/14/20 at  8:50 AM EDT by telephone at home and verified that I am speaking with the correct person using two identifiers.   I discussed the limitations, risks, security and privacy concerns of performing an evaluation and management service by telephone and the availability of in person appointments. I also discussed with the patient that there may be a patient responsible charge related to this service. The patient expressed understanding and agreed to proceed.   History:  Gina Whitaker is a 55 y.o. [phone] female being evaluated today for response to the treatment  From her visit 07/03/20 . She denies any abnormal vaginal discharge, bleeding, pelvic pain or other concerns.    Specifically she states she is doing well on both the myrbetriq and the "Fanny" cream, her symptoms have completely resolved     Past Medical History:  Diagnosis Date  . Asthma   . Diverticulitis   . Hypertension   . Sleep apnea    Past Surgical History:  Procedure Laterality Date  . APPENDECTOMY    . CHOLECYSTECTOMY    . COLONOSCOPY WITH PROPOFOL N/A 05/13/2020   Procedure: COLONOSCOPY WITH PROPOFOL;  Surgeon: Rogene Houston, MD;  [facility];  Service: Endoscopy;  Laterality: N/A;  730  . endometrial ablation    . NECK SURGERY  2017   t lift and fixation  C4 C5 C6 C7   . TEMPOROMANDIBULAR JOINT SURGERY     twice bilateral   The following portions of the patient's history were reviewed and updated as appropriate: allergies, current medications, past family history, past medical history, past social history, past surgical history and problem list.     Review of Systems:  Pertinent items noted in HPI and remainder of comprehensive ROS otherwise negative.  Physical Exam:  Physical exam deferred due to nature of the  encounter  Labs and Imaging No results found for this or any previous visit (from the past 336 hour(s)). No results found.     No orders of the defined types were placed in this encounter.   No orders of the defined types were placed in this encounter.   Assessment and Plan:       ICD-10-CM   1. Candidal vulvovaginitis, pad related: Chronic + stable, improved  B37.3    Continue Fanny cream and zinc oxide as needed, discussed  2. Mixed incontinence, stress dominant: chronic + stable  N39.46    Continue myrbetriq 50 qhs         I discussed the assessment and treatment plan with the patient. The patient was provided an opportunity to ask questions and all were answered. The patient agreed with the plan and demonstrated an understanding of the instructions.   The patient was advised to call back or seek an in-person evaluation/go to the ED if the symptoms worsen or if the condition fails to improve as anticipated.  I provided 10 minutes of non-face-to-face time during this encounter.   Florian Buff, [place] for [facility]

## 2020-08-25 ENCOUNTER — Other Ambulatory Visit (HOSPITAL_COMMUNITY): Payer: Self-pay

## 2020-08-31 DIAGNOSIS — I10 Essential (primary) hypertension: Secondary | ICD-10-CM | POA: Diagnosis not present

## 2020-08-31 DIAGNOSIS — D509 Iron deficiency anemia, unspecified: Secondary | ICD-10-CM | POA: Diagnosis not present

## 2020-10-16 ENCOUNTER — Other Ambulatory Visit (HOSPITAL_COMMUNITY): Payer: Self-pay

## 2020-10-16 MED FILL — Diltiazem HCl Coated Beads Cap ER 24HR 180 MG: ORAL | 90 days supply | Qty: 90 | Fill #0 | Status: AC

## 2020-10-19 ENCOUNTER — Other Ambulatory Visit (HOSPITAL_COMMUNITY): Payer: Self-pay

## 2020-11-05 ENCOUNTER — Inpatient Hospital Stay (HOSPITAL_COMMUNITY): Payer: 59

## 2020-11-12 ENCOUNTER — Ambulatory Visit (HOSPITAL_COMMUNITY): Payer: 59 | Admitting: Hematology

## 2020-11-19 ENCOUNTER — Inpatient Hospital Stay (HOSPITAL_COMMUNITY): Payer: 59 | Attending: Hematology

## 2020-11-19 ENCOUNTER — Other Ambulatory Visit: Payer: Self-pay

## 2020-11-19 DIAGNOSIS — D509 Iron deficiency anemia, unspecified: Secondary | ICD-10-CM | POA: Diagnosis not present

## 2020-11-19 LAB — CBC WITH DIFFERENTIAL/PLATELET
Abs Immature Granulocytes: 0.02 10*3/uL (ref 0.00–0.07)
Basophils Absolute: 0.1 10*3/uL (ref 0.0–0.1)
Basophils Relative: 1 %
Eosinophils Absolute: 0.3 10*3/uL (ref 0.0–0.5)
Eosinophils Relative: 5 %
HCT: 39.6 % (ref 36.0–46.0)
Hemoglobin: 13.2 g/dL (ref 12.0–15.0)
Immature Granulocytes: 0 %
Lymphocytes Relative: 30 %
Lymphs Abs: 1.8 10*3/uL (ref 0.7–4.0)
MCH: 30 pg (ref 26.0–34.0)
MCHC: 33.3 g/dL (ref 30.0–36.0)
MCV: 90 fL (ref 80.0–100.0)
Monocytes Absolute: 0.4 10*3/uL (ref 0.1–1.0)
Monocytes Relative: 7 %
Neutro Abs: 3.4 10*3/uL (ref 1.7–7.7)
Neutrophils Relative %: 57 %
Platelets: 260 10*3/uL (ref 150–400)
RBC: 4.4 MIL/uL (ref 3.87–5.11)
RDW: 12.6 % (ref 11.5–15.5)
WBC: 6 10*3/uL (ref 4.0–10.5)
nRBC: 0 % (ref 0.0–0.2)

## 2020-11-19 LAB — COMPREHENSIVE METABOLIC PANEL
ALT: 16 U/L (ref 0–44)
AST: 14 U/L — ABNORMAL LOW (ref 15–41)
Albumin: 4.1 g/dL (ref 3.5–5.0)
Alkaline Phosphatase: 59 U/L (ref 38–126)
Anion gap: 6 (ref 5–15)
BUN: 18 mg/dL (ref 6–20)
CO2: 26 mmol/L (ref 22–32)
Calcium: 8.9 mg/dL (ref 8.9–10.3)
Chloride: 104 mmol/L (ref 98–111)
Creatinine, Ser: 0.87 mg/dL (ref 0.44–1.00)
GFR, Estimated: >60 mL/min (ref >60)
Glucose, Bld: 108 mg/dL — ABNORMAL HIGH (ref 70–99)
Potassium: 4.1 mmol/L (ref 3.5–5.1)
Sodium: 136 mmol/L (ref 135–145)
Total Bilirubin: 0.6 mg/dL (ref 0.3–1.2)
Total Protein: 7 g/dL (ref 6.5–8.1)

## 2020-11-19 LAB — IRON AND TIBC
Iron: 83 ug/dL (ref 28–170)
Saturation Ratios: 23 % (ref 10.4–31.8)
TIBC: 363 ug/dL (ref 250–450)
UIBC: 280 ug/dL

## 2020-11-19 LAB — FERRITIN: Ferritin: 182 ng/mL (ref 11–307)

## 2020-11-19 LAB — VITAMIN B12: Vitamin B-12: 206 pg/mL (ref 180–914)

## 2020-11-25 ENCOUNTER — Ambulatory Visit (HOSPITAL_COMMUNITY): Payer: 59 | Admitting: Hematology and Oncology

## 2020-12-03 ENCOUNTER — Ambulatory Visit (HOSPITAL_COMMUNITY): Payer: 59 | Admitting: Physician Assistant

## 2020-12-08 ENCOUNTER — Telehealth: Payer: 59 | Admitting: Physician Assistant

## 2020-12-08 ENCOUNTER — Encounter: Payer: Self-pay | Admitting: Physician Assistant

## 2020-12-08 ENCOUNTER — Other Ambulatory Visit (HOSPITAL_COMMUNITY): Payer: Self-pay

## 2020-12-08 ENCOUNTER — Telehealth: Payer: Self-pay

## 2020-12-08 DIAGNOSIS — U071 COVID-19: Secondary | ICD-10-CM | POA: Diagnosis not present

## 2020-12-08 MED ORDER — MOLNUPIRAVIR EUA 200MG CAPSULE: 4.0000 | ORAL_CAPSULE | Freq: Two times a day (BID) | ORAL | 0 refills | Status: AC

## 2020-12-08 MED ORDER — NIRMATRELVIR/RITONAVIR (PAXLOVID)TABLET
3.0000 | ORAL_TABLET | Freq: Two times a day (BID) | ORAL | 0 refills | Status: DC
  Filled 2020-12-08: qty 30, 5d supply, fill #0

## 2020-12-08 MED FILL — Progesterone Cap 200 MG: ORAL | 90 days supply | Qty: 90 | Fill #0 | Status: AC

## 2020-12-08 NOTE — Progress Notes (Signed)
Virtual Visit Consent   Gina Whitaker, you are scheduled for a virtual visit with a [facility] provider today.     Just as with appointments in the office, your consent must be obtained to participate.  Your consent will be active for this visit and any virtual visit you may have with one of our providers in the next 365 days.     If you have a MyChart account, a copy of this consent can be sent to you electronically.  All virtual visits are billed to your insurance company just like a traditional visit in the office.    As this is a virtual visit, video technology does not allow for your provider to perform a traditional examination.  This may limit your provider's ability to fully assess your condition.  If your provider identifies any concerns that need to be evaluated in person or the need to arrange testing (such as labs, EKG, etc.), we will make arrangements to do so.     Although advances in technology are sophisticated, we cannot ensure that it will always work on either your end or our end.  If the connection with a video visit is poor, the visit may have to be switched to a telephone visit.  With either a video or telephone visit, we are not always able to ensure that we have a secure connection.     I need to obtain your verbal consent now.   Are you willing to proceed with your visit today?    Gina Whitaker has provided verbal consent on 12/08/2020 for a virtual visit (video or telephone).   Mar Daring, PA-C   Date: 12/08/2020 4:54 PM   Virtual Visit via Video Note   I, Mar Daring, connected with  Gina Whitaker  ([id number], 1965-12-12) on 12/08/20 at  4:45 PM EDT by a video-enabled telemedicine application and verified that I am speaking with the correct person using two identifiers.  Location: Patient: Virtual Visit Location Patient: Home Provider: Virtual Visit Location Provider: Home Office   I discussed the limitations of evaluation and  management by telemedicine and the availability of in person appointments. The patient expressed understanding and agreed to proceed.    History of Present Illness: Gina Whitaker is a 55 y.o. who identifies as a female who was assigned female at birth, and is being seen today for Covid 19.  HPI: URI  This is a new problem. Episode onset: Sunday, started with rhinorrhea, tested positive for Covid 19 today. The problem has been gradually worsening. There has been no fever. Associated symptoms include congestion, coughing (mildy productive), headaches, rhinorrhea, sinus pain and a sore throat (dry throat feeling). Pertinent negatives include no diarrhea, nausea, vomiting or wheezing. Associated symptoms comments: body aches. Treatments tried: OTC sinus medication and delsym. The treatment provided moderate relief.     Problems:  Patient Active Problem List   Diagnosis Date Noted   Iron deficiency anemia 01/23/2020   Hypertension 01/23/2020   Sleep apnea 01/23/2020    Allergies:  Allergies  Allergen Reactions   Hydrocodone Rash and Swelling   Latex Swelling and Rash   Tramadol     Rash and facial swelling   Erythromycin Nausea And Vomiting   Watermelon [Citrullus Vulgaris]     Oral itch syndrome    Codeine Itching and Rash   Morphine Itching, Swelling and Rash   Penicillins Rash   Medications:  Current Outpatient Medications:    molnupiravir EUA 200 mg CAPS, Take  4 capsules (800 mg total) by mouth 2 (two) times daily for 5 days., Disp: 40 capsule, Rfl: 0   acyclovir (ZOVIRAX) 400 MG tablet, Take 400 mg by mouth 5 (five) times daily as needed (fever blisters)., Disp: , Rfl:    acyclovir (ZOVIRAX) 400 MG tablet, TAKE 1 TABLET BY MOUTH 5 TIMES A DAY FOR 5 DAYS, Disp: 25 tablet, Rfl: 3   albuterol (VENTOLIN HFA) 108 (90 Base) MCG/ACT inhaler, Inhale into the lungs every 4 (four) hours as needed for wheezing or shortness of breath., Disp: , Rfl:    cetirizine (ZYRTEC) 10 MG tablet, Take  10 mg by mouth daily., Disp: , Rfl:    cyanocobalamin (,VITAMIN B-12,) 1000 MCG/ML injection, INJECT 1 ML (1,000 MCG TOTAL) INTO THE MUSCLE EVERY 30 (THIRTY) DAYS., Disp: 10 mL, Rfl: 0   cyanocobalamin (,VITAMIN B-12,) 1000 MCG/ML injection, INJECT 1 ML (1,000 MCG TOTAL) INTO THE MUSCLE ONCE FOR 1 DOSE., Disp: 1 mL, Rfl: 0   diltiazem (CARDIZEM CD) 180 MG 24 hr capsule, Take 180 mg by mouth daily., Disp: , Rfl:    diltiazem (CARDIZEM CD) 180 MG 24 hr capsule, TAKE 1 CAPSULE BY MOUTH ONCE DAILY, Disp: 90 capsule, Rfl: 4   docusate sodium (COLACE) 100 MG capsule, Take 100 mg by mouth daily., Disp: , Rfl:    ergocalciferol (VITAMIN D2) 1.25 MG (50000 UT) capsule, Take 1 capsule (50,000 Units total) by mouth once a week. (Patient taking differently: Take 50,000 Units by mouth every Thursday.), Disp: 10 capsule, Rfl: 0   estradiol (VIVELLE-DOT) 0.1 MG/24HR patch, PLACE 1 PATCH (0.1 MG TOTAL) ONTO THE SKIN 2 (TWO) TIMES A WEEK. (Patient taking differently: Place 1 patch onto the skin 2 (two) times a week. Changes on Mondays and Thursdays), Disp: 24 patch, Rfl: 3   fluconazole (DIFLUCAN) 100 MG tablet, TAKE 1 TABLET (100 MG TOTAL) BY MOUTH DAILY., Disp: 14 tablet, Rfl: 0   fluticasone (FLONASE) 50 MCG/ACT nasal spray, Place 2 sprays into both nostrils daily., Disp: , Rfl:    mirabegron ER (MYRBETRIQ) 50 MG TB24 tablet, TAKE 1 TABLET (50 MG TOTAL) BY MOUTH DAILY AT BEDTIME (Patient not taking: Reported on 08/07/2020), Disp: 30 tablet, Rfl: 2   Multiple Vitamins-Minerals (ADULT GUMMY PO), Take 2 capsules by mouth daily., Disp: , Rfl:    Nystatin (GERHARDT'S BUTT CREAM) CREA, Apply 1 application topically 2 (two) times daily., Disp: 1 each, Rfl: 11   ondansetron (ZOFRAN) 4 MG tablet, Take 1 tablet (4 mg total) by mouth every 6 (six) hours as needed for nausea or vomiting. (Patient not taking: Reported on 08/07/2020), Disp: 20 tablet, Rfl: 0   progesterone (PROMETRIUM) 200 MG capsule, TAKE 1 CAPSULE (200 MG TOTAL)  BY MOUTH DAILY AT NIGHT (Patient taking differently: Take 200 mg by mouth at bedtime.), Disp: 90 capsule, Rfl: 3   psyllium (METAMUCIL SMOOTH TEXTURE) 58.6 % powder, Take 1 packet by mouth at bedtime., Disp: , Rfl:    rizatriptan (MAXALT) 10 MG tablet, Take 10 mg by mouth daily as needed for migraine., Disp: , Rfl:    SUPREP BOWEL PREP KIT 17.5-3.13-1.6 GM/177ML SOLN, TAKE BY MOUTH AS DIECTED, Disp: 354 mL, Rfl: 0   Vitamin D, Ergocalciferol, (DRISDOL) 1.25 MG (50000 UNIT) CAPS capsule, TAKE 1 CAPSULE (50,000 UNITS TOTAL) BY MOUTH ONCE A WEEK., Disp: 10 capsule, Rfl: 0  Observations/Objective: Patient is well-developed, well-nourished in no acute distress.  Resting comfortably at home.  Head is normocephalic, atraumatic.  No labored breathing. Dry, frequent  cough heard without effecting speech Speech is clear and coherent with logical content.  Patient is alert and oriented at baseline.   Assessment and Plan: 1. COVID-19 - MyChart COVID-19 home monitoring program; Future - molnupiravir EUA 200 mg CAPS; Take 4 capsules (800 mg total) by mouth 2 (two) times daily for 5 days.  Dispense: 40 capsule; Refill: 0  - Continue OTC symptomatic management of choice - Will send OTC vitamins and supplement information through [facility] - Molnupiravir prescribed - Patient enrolled in MyChart symptom monitoring - Push fluids - Rest as needed - Referral to Covid treatment team placed, consult appreciated - Discussed return precautions and when to seek in-person evaluation, sent via [facility] as well  Follow Up Instructions: I discussed the assessment and treatment plan with the patient. The patient was provided an opportunity to ask questions and all were answered. The patient agreed with the plan and demonstrated an understanding of the instructions.  A copy of instructions were sent to the patient via MyChart.  The patient was advised to call back or seek an in-person evaluation if the symptoms worsen or if the  condition fails to improve as anticipated.  Time:  I spent 21 minutes with the patient via telehealth technology discussing the above problems/concerns.    Mar Daring, PA-C

## 2020-12-08 NOTE — Telephone Encounter (Signed)
Patient had video visit today and filled out the [facility] for worsening symptoms. Due to already evaluated by provider today, will not call patient at this time.

## 2020-12-08 NOTE — Patient Instructions (Signed)
Hello Gina Whitaker,  You are being placed in the home monitoring program for COVID-19 (commonly known as Coronavirus).  This is because you are suspected to have the virus or are known to have the virus.  If you are unsure which group you fall into call your clinic.    As part of this program, you'll answer a daily questionnaire in the MyChart mobile app. You'll receive a notification through the MyChart app when the questionnaire is available. When you log in to MyChart, you'll see the tasks in your To Do activity.       Clinicians will see any answers that are concerning and take appropriate steps.  If at any point you are having a medical emergency, call 911.  If otherwise concerned call your clinic instead of coming into the clinic or hospital.  To keep from spreading the disease you should: Stay home and limit contact with other people as much as possible.  Wash your hands frequently. Cover your coughs and sneezes with a tissue, and throw used tissues in the trash.   Clean and disinfect frequently touched surfaces and objects.    Take care of yourself by: Staying home Resting Drinking fluids Take fever-reducing medications (Tylenol/Acetaminophen and Ibuprofen)  For more information on the disease go to the Centers for Disease Control and Prevention website     COVID-19: Quarantine and Isolation Quarantine If you were exposed Quarantine and stay away from others when you have been in close contact with someone whohas COVID-19. Isolate If you are sick or test positive Isolate when you are sick or when you have COVID-19, even if you don't have symptoms. When to stay home Calculating quarantine The date of your exposure is considered day 0. Day 1 is the first full day after your last contact with a person who has had COVID-19. Stay home and away from other people for at least 5 days. Learn why CDC updated guidance for the general public. IF YOU were exposed to COVID-19 and are  NOT up-to-date IF YOU were exposed to COVID-19 and are NOT on COVID-19 vaccinations Quarantine for at least 5 days Stay home Stay home and quarantine for at least 5 full days. Wear a well-fitted mask if you must be around others in your home. Do not travel. Get tested Even if you don't develop symptoms, get tested at least 5 days after you last had close contact with someone with COVID-19. After quarantine Watch for symptoms Watch for symptoms until 10 days after you last had close contact with someone with COVID-19. Avoid travel It is best to avoid travel until a full 10 days after you last had close contact with someone with COVID-19. If you develop symptoms Isolate immediately and get tested. Continue to stay home until you know the results. Wear a well-fitted mask around others. Take precautions until day 10 Wear a mask Wear a well-fitted mask for 10 full days any time you are around others inside your home or in public. Do not go to places where you are unable to wear a mask. If you must travel during days 6-10, take precautions. Avoid being around people who are at high risk IF YOU were exposed to COVID-19 and are up-to-date IF YOU were exposed to COVID-19 and are on COVID-19 vaccinations No quarantine You do not need to stay home unless you develop symptoms. Get tested Even if you don't develop symptoms, get tested at least 5 days after you last had close contact with someone with  COVID-19. Watch for symptoms Watch for symptoms until 10 days after you last had close contact with someone with COVID-19. If you develop symptoms Isolate immediately and get tested. Continue to stay home until you know the results. Wear a well-fitted mask around others. Take precautions until day 10 Wear a mask Wear a well-fitted mask for 10 full days any time you are around others inside your home or in public. Do not go to places where you are unable to wear a mask. Take precautions if  traveling Avoid being around people who are at high risk IF YOU were exposed to COVID-19 and had confirmed COVID-19 within the past 90 days (you tested positive using a viral test) No quarantine You do not need to stay home unless you develop symptoms. Watch for symptoms Watch for symptoms until 10 days after you last had close contact with someone with COVID-19. If you develop symptoms Isolate immediately and get tested. Continue to stay home until you know the results. Wear a well-fitted mask around others. Take precautions until day 10 Wear a mask Wear a well-fitted mask for 10 full days any time you are around others inside your home or in public. Do not go to places where you are unable to wear a mask. Take precautions if traveling Avoid being around people who are at high risk Calculating isolation Day 0 is your first day of symptoms or a positive viral test. Day 1 is the first full day after your symptoms developed or your test specimen was collected. If you have COVID-19 or have symptoms, isolate for at least 5 days. IF YOU tested positive for COVID-19 or have symptoms, regardless of vaccination status Stay home for at least 5 days Stay home for 5 days and isolate from others in your home. Wear a well-fitted mask if you must be around others in your home. Do not travel. Ending isolation if you had symptoms End isolation after 5 full days if you are fever-free for 24 hours (without the use of fever-reducing medication) and your symptoms are improving. Ending isolation if you did NOT have symptoms End isolation after at least 5 full days after your positive test. If you were severely ill with COVID-19 or are immunocompromised You should isolate for at least 10 days. Consult your doctor before ending isolation. Take precautions until day 10 Wear a mask Wear a well-fitted mask for 10 full days any time you are around others inside your home or in public. Do not go to places where you  are unable to wear a mask. Do not travel Do not travel until a full 10 days after your symptoms started or the date your positive test was taken if you had no symptoms. Avoid being around people who are at high risk Definitions Exposure Contact with someone infected with SARS-CoV-2, the virus that causes COVID-19,in a way that increases the likelihood of getting infected with the virus. Close contact A close contact is someone who was less than 6 feet away from an infected person (laboratory-confirmed or a clinical diagnosis) for a cumulative total of 15 minutes or more over a 24-hour period. For example, three individual 5-minute exposures for a total of 15 minutes. People who are exposed to someone with COVID-19 after they completed at least 5 days of isolation are notconsidered close contacts. Julio Sicks is a strategy used to prevent transmission of COVID-19 by keeping people who have been in close contact with someone with COVID-19 apart from others. Who does  not need to quarantine? If you had close contact with someone with COVID-19 and you are in one of the following groups, you do not need to quarantine. You are up to date with your COVID-19 vaccines. You had confirmed COVID-19 within the last 90 days (meaning you tested positive using a viral test). You should wear a well-fitting mask around others for 10 days from the date of your last close contact with someone with COVID-19 (the date of last close contact is considered day 0). Get tested at least 5 days after you last had close contact with someone with COVID-19. If you test positive or develop COVID-19 symptoms, isolate from other people and follow recommendations in the Isolation section below. If you tested positive for COVID-19 with a viral test within the previous 90 days and subsequently recovered and remain without COVID-19 symptoms, you do not need to quarantine or get tested after close contact. You should wear a  well-fitting mask around others for 10 days from the date of your last close contact withsomeone with COVID-19 (the date of last close contact is considered day 0). Who should quarantine? If you come into close contact with someone with COVID-19, you should quarantine if you are not up to date on COVID-19 vaccines. This includes people who are not vaccinated. What to do for quarantine Stay home and away from other people for at least 5 days (day 0 through day 5) after your last contact with a person who has COVID-19. The date of your exposure is considered day 0. Wear a well-fitting mask when around others at home, if possible. For 10 days after your last close contact with someone with COVID-19, watch for fever (100.22F or greater), cough, shortness of breath, or other COVID-19 symptoms. If you develop symptoms, get tested immediately and isolate until you receive your test results. If you test positive, follow isolation recommendations. If you do not develop symptoms, get tested at least 5 days after you last had close contact with someone with COVID-19. If you test negative, you can leave your home, but continue to wear a well-fitting mask when around others at home and in public until 10 days after your last close contact with someone with COVID-19. If you test positive, you should isolate for at least 5 days from the date of your positive test (if you do not have symptoms). If you do develop COVID-19 symptoms, isolate for at least 5 days from the date your symptoms began (the date the symptoms started is day 0). Follow recommendations in the isolation section below. If you are unable to get a test 5 days after last close contact with someone with COVID-19, you can leave your home after day 5 if you have been without COVID-19 symptoms throughout the 5-day period. Wear a well-fitting mask for 10 days after your date of last close contact when around others at home and in public. Avoid people who are  immunocompromised or at high risk for severe disease, and nursing homes and other high-risk settings, until after at least 10 days. If possible, stay away from people you live with, especially people who are at higher risk for getting very sick from COVID-19, as well as others outside your home throughout the full 10 days after your last close contact with someone with COVID-19. If you are unable to quarantine, you should wear a well-fitting mask for 10 days when around others at home and in public. If you are unable to wear a mask when  around others, you should continue to quarantine for 10 days. Avoid people who are immunocompromised or at high risk for severe disease, and nursing homes and other high-risk settings, until after at least 10 days. See additional information about travel. Do not go to places where you are unable to wear a mask, such as restaurants and some gyms, and avoid eating around others at home and at work until after 10 days after your last close contact with someone with COVID-19. After quarantine Watch for symptoms until 10 days after your last close contact with someone with COVID-19. If you have symptoms, isolate immediately and get tested. Quarantine in high-risk congregate settings In certain congregate settings that have high risk of secondary transmission (such as Systems analyst and detention facilities, homeless shelters, or cruise ships), CDC recommends a 10-day quarantine for residents, regardless of vaccination and booster status. During periods of critical staffing shortages, facilities may consider shortening the quarantine period for staff to ensure continuity of operations. Decisions to shorten quarantine in these settings should be made in consultation with state, local, tribal, or territorial health departments and should take into consideration the context and characteristics of the facility. CDC's setting-specific guidance provides additional recommendations for  these settings. Isolation Isolation is used to separate people with confirmed or suspected COVID-19 from those without COVID-19. People who are in isolation should stay home until it's safe for them to be around others. At home, anyone sick or infected should separate from others, or wear a well-fitting mask when they need to be around others. People in isolation should stay in a specific "sick room" or area and use a separate bathroom if available. Everyone who has presumed or confirmed COVID-19 should stay home and isolate from other people for at least 5 full days (day 0 is the first day of symptoms or the date of the day of the positive viral test for asymptomatic persons). They should wear a mask when around others at home and in public for an additional 5 days. People who are confirmed to have COVID-19 or are showing symptoms of COVID-19 need to isolate regardless of their vaccination status. This includes: People who have a positive viral test for COVID-19, regardless of whether or not they have symptoms. People with symptoms of COVID-19, including people who are awaiting test results or have not been tested. People with symptoms should isolate even if they do not know if they have been in close contact with someone with COVID-19. What to do for isolation Monitor your symptoms. If you have an emergency warning sign (including trouble breathing), seek emergency medical care immediately. Stay in a separate room from other household members, if possible. Use a separate bathroom, if possible. Take steps to improve ventilation at home, if possible. Avoid contact with other members of the household and pets. Don't share personal household items, like cups, towels, and utensils. Wear a well-fitting mask when you need to be around other people. Learn more about what to do if you are sick and how to notify your contacts. Ending isolation for people who had COVID-19 and had symptoms If you had COVID-19  and had symptoms, isolate for at least 5 days. To calculate your 5-day isolation period, day 0 is your first day of symptoms. Day 1 is the first full day after your symptoms developed. You can leave isolation after 5 full days. You can end isolation after 5 full days if you are fever-free for 24 hours without the use of fever-reducing medication and  your other symptoms have improved (Loss of taste and smell may persist for weeks or months after recovery and need not delay the end of isolation). You should continue to wear a well-fitting mask around others at home and in public for 5 additional days (day 6 through day 10) after the end of your 5-day isolation period. If you are unable to wear a mask when around others, you should continue to isolate for a full 10 days. Avoid people who are immunocompromised or at high risk for severe disease, and nursing homes and other high-risk settings, until after at least 10 days. If you continue to have fever or your other symptoms have not improved after 5 days of isolation, you should wait to end your isolation until you are fever-free for 24 hours without the use of fever-reducing medication and your other symptoms have improved. Continue to wear a well-fitting mask. Contact your healthcare provider if you have questions. See additional information about travel. Do not go to places where you are unable to wear a mask, such as restaurants and some gyms, and avoid eating around others at home and at work until a full 10 days after your first day of symptoms. If an individual has access to a test and wants to test, the best approach is to use an antigen test1 towards the end of the 5-day isolation period. Collect the test sample only if you are fever-free for 24 hours without the use of fever-reducing medication and your other symptoms have improved (loss of taste and smell may persist for weeks or months after recovery and need not delay the end of isolation). If your  test result is positive, you should continue to isolate until day 10. If your test result is negative, you can end isolation, but continue to wear a well-fitting mask around others at home and in public until day 10. Follow additional recommendations for masking and avoiding travel as described above. 1As noted in the labeling for authorized over-the counter antigen tests: Negative results should be treated as presumptive. Negative results do not rule out SARS-CoV-2 infection and should not be used as the sole basis for treatment or patient management decisions, including infection control decisions. To improve results, antigen tests should be used twice over a three-day period with at least 24 hours and no more than 48 hours between tests. Note that these recommendations on ending isolation do not apply to people with moderate or severe COVID-19 or with weakened immune systems (immunocompromised). See section below for recommendations for when toend isolation for these groups. Ending isolation for people who tested positive for COVID-19 but had no symptoms If you test positive for COVID-19 and never develop symptoms, isolate for at least 5 days. Day 0 is the day of your positive viral test (based on the date you were tested) and day 1 is the first full day after the specimen was collected for your positive test. You can leave isolation after 5 full days. If you continue to have no symptoms, you can end isolation after at least 5 days. You should continue to wear a well-fitting mask around others at home and in public until day 10 (day 6 through day 10). If you are unable to wear a mask when around others, you should continue to isolate for 10 days. Avoid people who are immunocompromised or at high risk for severe disease, and nursing homes and other high-risk settings, until after at least 10 days. If you develop symptoms after testing  positive, your 5-day isolation period should start over. Day 0 is your  first day of symptoms. Follow the recommendations above for ending isolation for people who had COVID-19 and had symptoms. See additional information about travel. Do not go to places where you are unable to wear a mask, such as restaurants and some gyms, and avoid eating around others at home and at work until 10 days after the day of your positive test. If an individual has access to a test and wants to test, the best approach is to use an antigen test1 towards the end of the 5-day isolation period. If your test result is positive, you should continue to isolate until day 10. If your test result is negative, you can end isolation, but continue to wear a well-fitting mask around others at home and in public until day 10. Follow additionalrecommendations for masking and avoiding travel as described above. 1As noted in the labeling for authorized over-the counter antigen tests: Negative results should be treated as presumptive. Negative results do not rule out SARS-CoV-2 infection and should not be used as the sole basis for treatment or patient management decisions, including infection control decisions. To improve results, antigen tests should be used twice over a three-day period with at least 24 hours and no more than 48 hours between tests. Ending isolation for people who were severely ill with COVID-19 or have a weakened immune system (immunocompromised) People who are severely ill with COVID-19 (including those who were hospitalized or required intensive care or ventilation support) and people with compromised immune systems might need to isolate at home longer. They may also require testing with a viral test to determine when they can be around others. CDC recommends an isolation period of at least 10 and up to 20 days for people who were severely ill with COVID-19 and for people with weakened immune systems. Consult with your healthcare provider about when you can resume being aroundother  people. People who are immunocompromised should talk to their healthcare provider about the potential for reduced immune responses to COVID-19 vaccines and the need to continue to follow current prevention measures (including wearing a well-fitting mask, staying 6 feet apart from others they don't live with, and avoiding crowds and poorly ventilated indoor spaces) to protect themselves against COVID-19 until advised otherwise by their healthcare provider. Close contacts of immunocompromised people--including household members--should also be encouraged to receive all recommended COVID-19 vaccine doses to help protect these people. Isolation in high-risk congregate settings In certain high-risk congregate settings that have high risk of secondary transmission and where it is not feasible to cohort people (such as Systems analyst and detention facilities, homeless shelters, and cruise ships), CDC recommends a 10-day isolation period for residents. During periods of critical staffing shortages, facilities may consider shortening the isolation period for staff to ensure continuity of operations. Decisions to shorten isolation in these settings should be made in consultation with state, local, tribal, or territorial health departments and should take into consideration the context and characteristics of the facility. CDC's setting-specific guidance provides additional recommendations for these settings. This CDC guidance is meant to supplement--not replace--any federal, state, local,territorial, or tribal health and safety laws, rules, and regulations. Recommendations for specific settings These recommendations do not apply to healthcare professionals. For guidance specific to these settings, see Healthcare professionals: Interim Guidance for Optician, dispensing with SARS-CoV-2 Infection or Exposure to SARS-CoV-2 Patients, residents, and visitors to healthcare settings: Interim Infection Prevention and  Control Recommendations for Healthcare Personnel  During the Coronavirus Disease 2019 (COVID-19) Pandemic Additional setting-specific guidance and recommendations are available. These recommendations on quarantine and isolation do apply to [facility] settings. Additional guidance is available here: Overview of COVID-19 Quarantine for K-12 Schools Travelers: Travel information and recommendations Congregate facilities and other settings: [employer] for community, work, and school settings Ongoing COVID-19 exposure FAQs I live with someone with COVID-19, but I cannot be separated from them. How do we manage quarantine in this situation? It is very important for people with COVID-19 to remain apart from other people, if possible, even if they are living together. If separation of the person with COVID-19 from others that they live with is not possible, the other people that they live with will have ongoing exposure, meaning they will be repeatedly exposed until that person is no longer able to spread the virus to other people. In this situation, there are precautions you can take to limit the spread of COVID-19: The person with COVID-19 and everyone they live with should wear a well-fitting mask inside the home. If possible, one person should care for the person with COVID-19 to limit the number of people who are in close contact with the infected person. Take steps to protect yourself and others to reduce transmission in the home: Quarantine if you are not up to date with your COVID-19 vaccines. Isolate if you are sick or tested positive for COVID-19, even if you don't have symptoms. Learn more about the public health recommendations for testing, mask use and quarantine of close contacts, like yourself, who have ongoing exposure. These recommendations differ depending on your vaccination status. What should I do if I have ongoing exposure to COVID-19 from someone I live with? Recommendations for  this situation depend on your vaccination status: If you are not up to date on COVID-19 vaccines and have ongoing exposure to COVID-19, you should: Begin quarantine immediately and continue to quarantine throughout the isolation period of the person with COVID-19. Continue to quarantine for an additional 5 days starting the day after the end of isolation for the person with COVID-19. Get tested at least 5 days after the end of isolation of the infected person that lives with them. If you test negative, you can leave the home but should continue to wear a well-fitting mask when around others at home and in public until 10 days after the end of isolation for the person with COVID-19. Isolate immediately if you develop symptoms of COVID-19 or test positive. If you are up to date with COVID-19 vaccines and have ongoing exposure to COVID-19, you should: Get tested at least 5 days after your first exposure. A person with COVID-19 is considered infectious starting 2 days before they develop symptoms, or 2 days before the date of their positive test if they do not have symptoms. Get tested again at least 5 days after the end of isolation for the person with COVID-19. Wear a well-fitting mask when you are around the person with COVID-19, and do this throughout their isolation period. Wear a well-fitting mask around others for 10 days after the infected person's isolation period ends. Isolate immediately if you develop symptoms of COVID-19 or test positive. What should I do if multiple people I live with test positive for COVID-19 at different times? Recommendations for this situation depend on your vaccination status: If you are not up to date with your COVID-19 vaccines, you should: Quarantine throughout the isolation period of any infected person that you live with.  Continue to quarantine until 5 days after the end of isolation date for the most recently infected person that lives with you. For example, if  the last day of isolation of the person most recently infected with COVID-19 was June 30, the new 5-day quarantine period starts on July 1. Get tested at least 5 days after the end of isolation for the most recently infected person that lives with you. Wear a well-fitting mask when you are around any person with COVID-19 while that person is in isolation. Wear a well-fitting mask when you are around other people until 10 days after your last close contact. Isolate immediately if you develop symptoms of COVID-19 or test positive. If you are up to date with COVID-19 your vaccines , you should: Get tested at least 5 days after your first exposure. A person with COVID-19 is considered infectious starting 2 days before they developed symptoms, or 2 days before the date of their positive test if they do not have symptoms. Get tested again at least 5 days after the end of isolation for the most recently infected person that lives with you. Wear a well-fitting mask when you are around any person with COVID-19 while that person is in isolation. Wear a well-fitting mask around others for 10 days after the end of isolation for the most recently infected person that lives with you. For example, if the last day of isolation for the person most recently infected with COVID-19 was June 30, the new 10-day period to wear a well-fitting mask indoors in public starts on July 1. Isolate immediately if you develop symptoms of COVID-19 or test positive. I had COVID-19 and completed isolation. Do I have to quarantine or get tested if someone I live with gets COVID-19 shortly after I completed isolation? No. If you recently completed isolation and someone that lives with you tests positive for the virus that causes COVID-19 shortly after the end of your isolation period, you do not have to quarantine or get tested as long as you do not develop new symptoms. Once all of the people that live together have completed isolation or  quarantine, refer to the guidance below for new exposures to COVID-19. If you had COVID-19 in the previous 90 days and then came into close contact with someone with COVID-19, you do not have to quarantine or get tested if you do not have symptoms. But you should: Wear a well-fitting mask indoors in public for 10 days after exposure. Monitor for COVID-19 symptoms and isolate immediately if symptoms develop. Consult with a healthcare provider for testing recommendations if new symptoms develop. If more than 90 days have passed since your recovery from infection, follow CDC's recommendations for close contacts. These recommendations will differ depending on your vaccination status. 06/11/2020 Content source: [facility] for Immunization and Respiratory Diseases ([facility]), Division of Viral Diseases This information is not intended to replace advice given to you by your health care provider. Make sure you discuss any questions you have with your healthcare provider. Document Revised: 06/19/2020 Document Reviewed: 06/19/2020 Elsevier Patient Education  2022 [place]: What to Do if You Are Sick CDC has updated isolation and quarantine recommendations for the public, and is revising the CDC website to reflect these changes. These recommendations do not apply to healthcare personnel and do not supersede state, local, tribal, or territorial laws, rules, andregulations. If you have a fever, cough or other symptoms, you might have COVID-19. Most people have mild illness  and are able to recover at home. If you are sick: Keep track of your symptoms. If you have an emergency warning sign (including trouble breathing), call 911. Steps to help prevent the spread of COVID-19 if you are sick If you are sick with COVID-19 or think you might have COVID-19, follow the steps below to care for yourself and to help protect other peoplein your home and community. Stay home except to get medical  care Stay home. Most people with COVID-19 have mild illness and can recover at home without medical care. Do not leave your home, except to get medical care. Do not visit public areas. Take care of yourself. Get rest and stay hydrated. Take over-the-counter medicines, such as acetaminophen, to help you feel better. Stay in touch with your doctor. Call before you get medical care. Be sure to get care if you have trouble breathing, or have any other emergency warning signs, or if you think it is an emergency. Avoid public transportation, ride-sharing, or taxis. Separate yourself from other people As much as possible, stay in a specific room and away from other people and pets in your home. If possible, you should use a separate bathroom. If you need to be around other people or animals in oroutside of the home, wear a mask. Tell your close contactsthat they may have been exposed to COVID-19. An infected person can spread COVID-19 starting 48 hours (or 2 days) before the person has any symptoms or tests positive. By letting your close contacts know they may have been exposed to COVID-19, you are helping to protect everyone. Additional guidance is available for those living in close quarters and shared housing. See COVID-19 and Animals if you have questions about pets. If you are diagnosed with COVID-19, someone from the health department may call you. Answer the call to slow the spread. Monitor your symptoms Symptoms of COVID-19 include fever, cough, or other symptoms. Follow care instructions from your healthcare provider and local health department. Your local health authorities may give instructions on checking your symptoms and reporting information. When to seek emergency medical attention Look for emergency warning signs* for COVID-19. If someone is showing any of these signs, seek emergency medical care immediately: Trouble breathing Persistent pain or pressure in the chest New  confusion Inability to wake or stay awake Pale, gray, or blue-colored skin, lips, or nail beds, depending on skin tone *This list is not all possible symptoms. Please call your medical provider forany other symptoms that are severe or concerning to you. Call 911 or call ahead to your local emergency facility: Notify the operator that you are seeking care for someone who has or may haveCOVID-19. Call ahead before visiting your doctor Call ahead. Many medical visits for routine care are being postponed or done by phone or telemedicine. If you have a medical appointment that cannot be postponed, call your doctor's office, and tell them you have or may have COVID-19. This will help the office protect themselves and other patients. Get tested If you have symptoms of COVID-19, get tested. While waiting for test results, you stay away from others, including staying apart from those living in your household. Self-tests are one of several options for testing for the virus that causes COVID-19 and may be more convenient than laboratory-based tests and point-of-care tests. Ask your healthcare provider or your local health department if you need help interpreting your test results. You can visit your state, tribal, local, and territorial health department's website to look  for the latest local information on testing sites. If you are sick, wear a mask over your nose and mouth You should wear a mask over your nose and mouth if you must be around other people or animals, including pets (even at home). You don't need to wear the mask if you are alone. If you can't put on a mask (because of trouble breathing, for example), cover your coughs and sneezes in some other way. Try to stay at least 6 feet away from other people. This will help protect the people around you. Masks should not be placed on young children under age 9 years, anyone who has trouble breathing, or anyone who is not able to remove the mask without  help. Note: During the COVID-19 pandemic, medical grade facemasks are reserved forhealthcare workers and some first responders. Cover your coughs and sneezes Cover your mouth and nose with a tissue when you cough or sneeze. Throw away used tissues in a lined trash can. Immediately wash your hands with soap and water for at least 20 seconds. If soap and water are not available, clean your hands with an alcohol-based hand sanitizer that contains at least 60% alcohol. Clean your hands often Wash your hands often with soap and water for at least 20 seconds. This is especially important after blowing your nose, coughing, or sneezing; going to the bathroom; and before eating or preparing food. Use hand sanitizer if soap and water are not available. Use an alcohol-based hand sanitizer with at least 60% alcohol, covering all surfaces of your hands and rubbing them together until they feel dry. Soap and water are the best option, especially if hands are visibly dirty. Avoid touching your eyes, nose, and mouth with unwashed hands. Handwashing Tips Avoid sharing personal household items Do not share dishes, drinking glasses, cups, eating utensils, towels, or bedding with other people in your home. Wash these items thoroughly after using them with soap and water or put in the dishwasher. Clean all "high-touch" surfaces every day Clean and disinfect high-touch surfaces in your "sick room" and bathroom; wear disposable gloves. Let someone else clean and disinfect surfaces in common areas, but you should clean your bedroom and bathroom, if possible. If a caregiver or other person needs to clean and disinfect a sick person's bedroom or bathroom, they should do so on an as-needed basis. The caregiver/other person should wear a mask and disposable gloves prior to cleaning. They should wait as long as possible after the person who is sick has used the bathroom before coming in to clean and use the  bathroom. High-touch surfaces include phones, remote controls, counters, tabletops, doorknobs, bathroom fixtures, toilets, keyboards, tablets, and bedside tables. Clean and disinfect areas that may have blood, stool, or body fluids on them. Use household cleaners and disinfectants. Clean the area or item with soap and water or another detergent if it is dirty. Then, use a household disinfectant. Be sure to follow the instructions on the label to ensure safe and effective use of the product. Many products recommend keeping the surface wet for several minutes to ensure germs are killed. Many also recommend precautions such as wearing gloves and making sure you have good ventilation during use of the product. Use a product from [employer]: Disinfectants for Coronavirus ([id number]). Complete Disinfection Guidance When you can be around others after being sick with COVID-19 Deciding when you can be around others is different for different situations. Find out when you can safely end home isolation.  For any additional questions about your care,contact your healthcare provider or state or local health department. 04/22/2020 Content source: [facility] for Immunization and Respiratory Diseases ([facility]), Division of Viral Diseases This information is not intended to replace advice given to you by your health care provider. Make sure you discuss any questions you have with your healthcare provider. Document Revised: 06/19/2020 Document Reviewed: 06/19/2020 Elsevier Patient Education  2022 [place] are being prescribed MOLNUPIRAVIR for COVID-19 infection.   Please pick up your prescription at: [facility] [address] [place], [place] [address] [phone]) Hours Monday through Friday 8a-7p, Saturday 8a-5p, Sunday 1p-5p  Please call the pharmacy or go through the drive through vs going inside if you are picking up the mediation yourself to prevent further spread. If  prescribed to a [facility] affiliated pharmacy, a pharmacist will bring the medication out to your car.   ADMINISTRATION INSTRUCTIONS: Take with or without food. Swallow the tablets whole. Don't chew, crush, or break the medications because it might not work as well  For each dose of the medication, you should be taking FOUR tablets at one time, TWICE a day   Finish your full five-day course of Molnupiravir even if you feel better before you're done. Stopping this medication too early can make it less effective to prevent severe illness related to [place].    Molnupiravir is prescribed for YOU ONLY. Don't share it with others, even if they have similar symptoms as you. This medication might not be right for everyone.   Make sure to take steps to protect yourself and others while you're taking this medication in order to get well soon and to prevent others from getting sick with COVID-19.   **If you are of childbearing potential (any gender) - it is advised to not get pregnant while taking this medication and recommended that condoms are used for female partners the next 3 months after taking the medication out of extreme caution    COMMON SIDE EFFECTS: Diarrhea Nausea  Dizziness    If your COVID-19 symptoms get worse, get medical help right away. Call 911 if you experience symptoms such as worsening cough, trouble breathing, chest pain that doesn't go away, confusion, a hard time staying awake, and pale or blue-colored skin. This medication won't prevent all COVID-19 cases from getting worse.  Can take to lessen severity: Vit C '500mg'$  twice daily Quercertin 250-'500mg'$  twice daily Zinc 75-'100mg'$  daily Melatonin 3-6 mg at bedtime Vit D3 1000-2000 IU daily Aspirin 81 mg daily with food Optional: Famotidine '20mg'$  daily Also can add tylenol/ibuprofen as needed for fevers and body aches May add Mucinex or Mucinex DM as needed for cough/congestion  10 Things You Can Do to Manage Your  COVID-19 Symptoms at Home If you have possible or confirmed COVID-19 Stay home except to get medical care. Monitor your symptoms carefully. If your symptoms get worse, call your healthcare provider immediately. Get rest and stay hydrated. If you have a medical appointment, call the healthcare provider ahead of time and tell them that you have or may have COVID-19. For medical emergencies, call 911 and notify the dispatch personnel that you have or may have COVID-19. Cover your cough and sneezes with a tissue or use the inside of your elbow. Wash your hands often with soap and water for at least 20 seconds or clean your hands with an alcohol-based hand sanitizer that contains at least 60% alcohol. As much as possible,  stay in a specific room and away from other people in your home. Also, you should use a separate bathroom, if available. If you need to be around other people in or outside of the home, wear a mask. Avoid sharing personal items with other people in your household, like dishes, towels, and bedding. Clean all surfaces that are touched often, like counters, tabletops, and doorknobs. Use household cleaning sprays or wipes according to the label instructions. [email or web address] 11/29/2019 This information is not intended to replace advice given to you by your health care provider. Make sure you discuss any questions you have with your healthcare provider. Document Revised: 06/19/2020 Document Reviewed: 06/19/2020 Elsevier Patient Education  [place].

## 2020-12-10 ENCOUNTER — Ambulatory Visit (HOSPITAL_COMMUNITY): Payer: 59 | Admitting: Nurse Practitioner

## 2020-12-11 ENCOUNTER — Telehealth: Payer: Self-pay | Admitting: Internal Medicine

## 2020-12-11 NOTE — Telephone Encounter (Signed)
Called pt due to BPA fired from [employer]. Pt has no appetite, states she is drinking plenty of fluids to prevent dehydration but now is nauseated. Asked what she is drinking and she stated unsweet tea. Explained she may need to add something with calories due to low blood sugar can also cause nausea. States she will add something that has calories and continue to fill out her survey daily.

## 2020-12-14 ENCOUNTER — Other Ambulatory Visit (HOSPITAL_COMMUNITY): Payer: Self-pay

## 2020-12-16 ENCOUNTER — Ambulatory Visit (HOSPITAL_COMMUNITY)
Admission: RE | Admit: 2020-12-16 | Discharge: 2020-12-16 | Disposition: A | Discharge status: Home / Self Care | Payer: 59 | Source: Ambulatory Visit | Attending: Internal Medicine | Admitting: Internal Medicine

## 2020-12-16 ENCOUNTER — Other Ambulatory Visit (HOSPITAL_COMMUNITY): Payer: Self-pay | Admitting: Internal Medicine

## 2020-12-16 ENCOUNTER — Other Ambulatory Visit: Payer: Self-pay

## 2020-12-16 DIAGNOSIS — U099 Post covid-19 condition, unspecified: Secondary | ICD-10-CM | POA: Insufficient documentation

## 2020-12-16 DIAGNOSIS — R0602 Shortness of breath: Secondary | ICD-10-CM | POA: Diagnosis not present

## 2020-12-21 ENCOUNTER — Other Ambulatory Visit (HOSPITAL_COMMUNITY): Payer: Self-pay

## 2020-12-21 MED FILL — Estradiol TD Patch Twice Weekly 0.1 MG/24HR: TRANSDERMAL | 84 days supply | Qty: 24 | Fill #0 | Status: AC

## 2020-12-31 ENCOUNTER — Other Ambulatory Visit (HOSPITAL_COMMUNITY): Payer: Self-pay

## 2020-12-31 DIAGNOSIS — R051 Acute cough: Secondary | ICD-10-CM | POA: Diagnosis not present

## 2020-12-31 DIAGNOSIS — I10 Essential (primary) hypertension: Secondary | ICD-10-CM | POA: Diagnosis not present

## 2020-12-31 DIAGNOSIS — G4733 Obstructive sleep apnea (adult) (pediatric): Secondary | ICD-10-CM | POA: Diagnosis not present

## 2020-12-31 MED ORDER — BENZONATATE 200 MG PO CAPS
200.0000 mg | ORAL_CAPSULE | Freq: Three times a day (TID) | ORAL | 2 refills | Status: DC | PRN
  Filled 2020-12-31: qty 30, 10d supply, fill #0

## 2021-01-14 ENCOUNTER — Other Ambulatory Visit: Payer: Self-pay

## 2021-01-14 MED FILL — Estradiol TD Patch Twice Weekly 0.1 MG/24HR: TRANSDERMAL | 84 days supply | Qty: 24 | Fill #1 | Status: CN

## 2021-01-14 MED FILL — Cyanocobalamin Inj 1000 MCG/ML: INTRAMUSCULAR | Qty: 10 | Fill #0 | Status: CN

## 2021-01-15 ENCOUNTER — Other Ambulatory Visit: Payer: Self-pay

## 2021-01-15 MED ORDER — DILTIAZEM HCL ER COATED BEADS 180 MG PO CP24
180.0000 mg | ORAL_CAPSULE | Freq: Every day | ORAL | 4 refills | Status: DC
  Filled 2021-01-15: qty 30, 30d supply, fill #0
  Filled 2021-04-25: qty 90, 90d supply, fill #0

## 2021-01-25 ENCOUNTER — Other Ambulatory Visit (HOSPITAL_COMMUNITY): Payer: Self-pay

## 2021-01-25 MED ORDER — DILTIAZEM HCL ER COATED BEADS 180 MG PO CP24
180.0000 mg | ORAL_CAPSULE | Freq: Every day | ORAL | 4 refills | Status: DC
  Filled 2021-01-25: qty 90, 90d supply, fill #0
  Filled 2021-07-22: qty 90, 90d supply, fill #1
  Filled 2021-10-18: qty 90, 90d supply, fill #2
  Filled 2022-01-11: qty 90, 90d supply, fill #3

## 2021-02-08 ENCOUNTER — Ambulatory Visit
Admission: EM | Admit: 2021-02-08 | Discharge: 2021-02-08 | Disposition: A | Discharge status: Home / Self Care | Payer: 59 | Attending: Family Medicine | Admitting: Family Medicine

## 2021-02-08 ENCOUNTER — Other Ambulatory Visit: Payer: Self-pay

## 2021-02-08 ENCOUNTER — Encounter: Payer: Self-pay | Admitting: Emergency Medicine

## 2021-02-08 DIAGNOSIS — H60392 Other infective otitis externa, left ear: Secondary | ICD-10-CM | POA: Diagnosis not present

## 2021-02-08 MED ORDER — NEOMYCIN-POLYMYXIN-HC 3.5-10000-1 OT SUSP: 3.0000 [drp] | Freq: Three times a day (TID) | OTIC | 0 refills | Status: DC

## 2021-02-08 MED ORDER — PREDNISONE 20 MG PO TABS: 40.0000 mg | ORAL_TABLET | Freq: Every day | ORAL | 0 refills | Status: DC

## 2021-02-08 NOTE — ED Triage Notes (Signed)
Pt here for left earache starting yesterday; pt sts was on way back from beach

## 2021-02-10 NOTE — ED Provider Notes (Signed)
[place]   [id number] 02/08/21 Arrival Time: 1517  ASSESSMENT & PLAN:  1. Infective otitis externa of left ear    Begin: Meds ordered this encounter  Medications   neomycin-polymyxin-hydrocortisone (CORTISPORIN) 3.5-10000-1 OTIC suspension    Sig: Place 3 drops into the left ear 3 (three) times daily.    Dispense:  10 mL    Refill:  0   predniSONE (DELTASONE) 20 MG tablet    Sig: Take 2 tablets (40 mg total) by mouth daily.    Dispense:  10 tablet    Refill:  0     Follow-up Information     [name], MD.   Specialty: Internal Medicine Why: As needed. Contact information: [address] [place] [address] [facility] [place].   Specialty: Urgent Care Why: If worsening or failing to improve as anticipated. Contact information: [address] [place] [address] [phone]                Reviewed expectations re: course of current medical issues. Questions answered. Outlined signs and symptoms indicating need for more acute intervention. Understanding verbalized. After Visit Summary given.   SUBJECTIVE: History from: patient. Gina Whitaker is a 55 y.o. female who reports L earache; abrupt onset yesterday while driving back from beach. Sharp pain; now dull; fairly persistent. Afebrile. No ear drainage or bleeding. Denies: fever, cough, sore throat, difficulty breathing, and headache. Normal PO intake without n/v/d.   OBJECTIVE:  Vitals:   02/08/21 1158  BP: (!) 158/87  Pulse: 68  Resp: 14  Temp: 98.7 F (37.1 C)  TempSrc: Oral  SpO2: 96%    General appearance: alert; no distress Eyes: PERRLA; EOMI; conjunctiva normal HENT: [place]; AT; with slight nasal congestion; L EAC is somewhat swollen and inflamed; TM appears normal Neck: supple  Lungs: speaks full sentences without difficulty; unlabored Extremities: no edema Skin: warm and  dry Neurologic: normal gait Psychological: alert and cooperative; normal mood and affect  Allergies  Allergen Reactions   Hydrocodone Rash and Swelling   Latex Swelling and Rash   Tramadol     Rash and facial swelling   Erythromycin Nausea And Vomiting   Watermelon [Citrullus Vulgaris]     Oral itch syndrome    Codeine Itching and Rash   Morphine Itching, Swelling and Rash   Penicillins Rash    Past Medical History:  Diagnosis Date   Asthma    Diverticulitis    Hypertension    Sleep apnea    Social History   Socioeconomic History   Marital status: Married    Spouse name: Not on file   Number of children: 2   Years of education: Not on file   Highest education level: Not on file  Occupational History   Not on file  Tobacco Use   Smoking status: Former    Packs/day: 3.00    Years: 6.00    Pack years: 18.00    Types: Cigarettes    Quit date: 24    Years since quitting: 28.7   Smokeless tobacco: Never  Vaping Use   Vaping Use: Never used  Substance and Sexual Activity   Alcohol use: Never   Drug use: Never   Sexual activity: Not Currently    Birth control/protection: Surgical    Comment: ablation  Other Topics Concern   Not on file  Social History Narrative   Not on file  Social Determinants of Health   Financial Resource Strain: Not on file  Food Insecurity: Not on file  Transportation Needs: Not on file  Physical Activity: Not on file  Stress: Not on file  Social Connections: Not on file  Intimate Partner Violence: Not on file   Family History  Problem Relation Age of Onset   Congestive Heart Failure Paternal Grandfather    Heart attack Paternal Grandfather    Dementia Paternal Grandfather    Basal cell carcinoma Paternal Grandfather    Cancer Paternal Grandmother        pancreatic   Aneurysm Maternal Grandmother    Hypertension Maternal Grandmother    CAD Maternal Grandmother    Heart attack Maternal Grandfather    Mental illness  Maternal Grandfather    Hypertension Father    Basal cell carcinoma Father    Cancer Father        prostate   Glaucoma Father    Hypertension Mother    Melanoma Mother    High Cholesterol Mother    Irritable bowel syndrome Mother    Iron deficiency Mother    Arthritis Mother    High Cholesterol Brother    Hypertension Brother    Other Brother    Asthma Daughter    Polycystic ovary syndrome Daughter    Past Surgical History:  Procedure Laterality Date   APPENDECTOMY     CHOLECYSTECTOMY     COLONOSCOPY WITH PROPOFOL N/A 05/13/2020   Procedure: COLONOSCOPY WITH PROPOFOL;  Surgeon: Rogene Houston, MD;  Location: [facility];  Service: Endoscopy;  Laterality: N/A;  730   endometrial ablation     NECK SURGERY  2017   t lift and fixation  C4 C5 C6 C7    TEMPOROMANDIBULAR JOINT SURGERY     twice bilateral     Vanessa Kick, MD 02/10/21 [phone]

## 2021-02-17 ENCOUNTER — Ambulatory Visit
Admission: EM | Admit: 2021-02-17 | Discharge: 2021-02-17 | Disposition: A | Discharge status: Home / Self Care | Payer: 59 | Attending: Family Medicine | Admitting: Family Medicine

## 2021-02-17 ENCOUNTER — Other Ambulatory Visit: Payer: Self-pay

## 2021-02-17 DIAGNOSIS — J029 Acute pharyngitis, unspecified: Secondary | ICD-10-CM

## 2021-02-17 DIAGNOSIS — R52 Pain, unspecified: Secondary | ICD-10-CM

## 2021-02-17 DIAGNOSIS — Z20822 Contact with and (suspected) exposure to covid-19: Secondary | ICD-10-CM | POA: Diagnosis not present

## 2021-02-17 NOTE — Discharge Instructions (Signed)
You have been tested for COVID-19 and influenza today. If your test returns positive, you will receive a phone call from [facility] regarding your results. Negative test results are not called. Both positive and negative results area always visible on MyChart. If you do not have a MyChart account, sign up instructions are provided in your discharge papers. Please do not hesitate to contact us should you have questions or concerns.  

## 2021-02-17 NOTE — ED Triage Notes (Signed)
Pt presents with complaints of bodyaches, fever, and chills that started today. Pt just got off steroids for her ear.

## 2021-02-17 NOTE — ED Provider Notes (Signed)
Jet   [id number] 02/17/21 Arrival Time: 3546  ASSESSMENT & PLAN:  1. Encounter for screening laboratory testing for COVID-19 virus   2. Body aches   3. Sore throat    COVID-19/influenza testing sent. OTC symptom care as needed. Work note provided.   Follow-up Information     Gina Noble, MD.   Specialty: Internal Medicine Why: As needed. Contact information: [address] [place] [address] [phone]                 Reviewed expectations re: course of current medical issues. Questions answered. Outlined signs and symptoms indicating need for more acute intervention. Understanding verbalized. After Visit Summary given.   SUBJECTIVE: History from: patient. Gina Whitaker is a 55 y.o. female who reports body aches, subj fever and chills; abrupt onset today. L ear better after tx from last visit. Denies: cough and difficulty breathing. Normal PO intake without n/v/d.   OBJECTIVE:  Vitals:   02/17/21 1023  BP: 126/80  Pulse: 84  Resp: 19  Temp: 98.9 F (37.2 C)  SpO2: 97%    General appearance: alert; no distress Eyes: PERRLA; EOMI; conjunctiva normal HENT: [place]; AT; with nasal congestion; throat is irritated/erythematous; no tonsil enlargement Neck: supple  Lungs: speaks full sentences without difficulty; unlabored Extremities: no edema Skin: warm and dry Neurologic: normal gait Psychological: alert and cooperative; normal mood and affect  Labs:  Labs Reviewed  COVID-19, FLU A+B NAA     Allergies  Allergen Reactions   Hydrocodone Rash and Swelling   Latex Swelling and Rash   Tramadol     Rash and facial swelling   Erythromycin Nausea And Vomiting   Watermelon [Citrullus Vulgaris]     Oral itch syndrome    Codeine Itching and Rash   Morphine Itching, Swelling and Rash   Penicillins Rash    Past Medical History:  Diagnosis Date   Asthma    Diverticulitis    Hypertension    Sleep apnea    Social  History   Socioeconomic History   Marital status: Married    Spouse name: Not on file   Number of children: 2   Years of education: Not on file   Highest education level: Not on file  Occupational History   Not on file  Tobacco Use   Smoking status: Former    Packs/day: 3.00    Years: 6.00    Pack years: 18.00    Types: Cigarettes    Quit date: 43    Years since quitting: 28.7   Smokeless tobacco: Never  Vaping Use   Vaping Use: Never used  Substance and Sexual Activity   Alcohol use: Never   Drug use: Never   Sexual activity: Not Currently    Birth control/protection: Surgical    Comment: ablation  Other Topics Concern   Not on file  Social History Narrative   Not on file   Social Determinants of Health   Financial Resource Strain: Not on file  Food Insecurity: Not on file  Transportation Needs: Not on file  Physical Activity: Not on file  Stress: Not on file  Social Connections: Not on file  Intimate Partner Violence: Not on file   Family History  Problem Relation Age of Onset   Congestive Heart Failure Paternal Grandfather    Heart attack Paternal Grandfather    Dementia Paternal Grandfather    Basal cell carcinoma Paternal Grandfather    Cancer Paternal Grandmother  pancreatic   Aneurysm Maternal Grandmother    Hypertension Maternal Grandmother    CAD Maternal Grandmother    Heart attack Maternal Grandfather    Mental illness Maternal Grandfather    Hypertension Father    Basal cell carcinoma Father    Cancer Father        prostate   Glaucoma Father    Hypertension Mother    Melanoma Mother    High Cholesterol Mother    Irritable bowel syndrome Mother    Iron deficiency Mother    Arthritis Mother    High Cholesterol Brother    Hypertension Brother    Other Brother    Asthma Daughter    Polycystic ovary syndrome Daughter    Past Surgical History:  Procedure Laterality Date   APPENDECTOMY     CHOLECYSTECTOMY     COLONOSCOPY WITH  PROPOFOL N/A 05/13/2020   Procedure: COLONOSCOPY WITH PROPOFOL;  Surgeon: Rogene Houston, MD;  Location: [facility];  Service: Endoscopy;  Laterality: N/A;  730   endometrial ablation     NECK SURGERY  2017   t lift and fixation  C4 C5 C6 C7    TEMPOROMANDIBULAR JOINT SURGERY     twice bilateral     Vanessa Kick, MD 02/17/21 1053

## 2021-02-18 ENCOUNTER — Ambulatory Visit: Payer: Self-pay

## 2021-02-18 DIAGNOSIS — R070 Pain in throat: Secondary | ICD-10-CM | POA: Diagnosis not present

## 2021-02-18 DIAGNOSIS — J029 Acute pharyngitis, unspecified: Secondary | ICD-10-CM | POA: Diagnosis not present

## 2021-02-18 LAB — COVID-19, FLU A+B NAA
Influenza A, NAA: NOT DETECTED
Influenza B, NAA: NOT DETECTED
SARS-CoV-2, NAA: NOT DETECTED

## 2021-03-16 ENCOUNTER — Other Ambulatory Visit: Payer: Self-pay | Admitting: Obstetrics & Gynecology

## 2021-03-22 ENCOUNTER — Other Ambulatory Visit (HOSPITAL_COMMUNITY): Payer: Self-pay

## 2021-03-22 ENCOUNTER — Other Ambulatory Visit: Payer: Self-pay | Admitting: Obstetrics & Gynecology

## 2021-03-24 ENCOUNTER — Other Ambulatory Visit (HOSPITAL_COMMUNITY): Payer: Self-pay

## 2021-03-25 ENCOUNTER — Other Ambulatory Visit: Payer: Self-pay | Admitting: Obstetrics & Gynecology

## 2021-03-25 ENCOUNTER — Other Ambulatory Visit (HOSPITAL_COMMUNITY): Payer: Self-pay

## 2021-03-29 ENCOUNTER — Other Ambulatory Visit (HOSPITAL_COMMUNITY): Payer: Self-pay

## 2021-04-25 ENCOUNTER — Telehealth: Payer: 59 | Admitting: Emergency Medicine

## 2021-04-25 DIAGNOSIS — R300 Dysuria: Secondary | ICD-10-CM

## 2021-04-25 MED ORDER — NITROFURANTOIN MONOHYD MACRO 100 MG PO CAPS: 100.0000 mg | ORAL_CAPSULE | Freq: Two times a day (BID) | ORAL | 0 refills | Status: AC

## 2021-04-25 NOTE — Progress Notes (Signed)
I have spent 5 minutes in review of e-visit questionnaire, review and updating patient chart, medical decision making and response to patient.   Landry Kamath, PA-C    

## 2021-04-25 NOTE — Progress Notes (Signed)

## 2021-04-26 ENCOUNTER — Other Ambulatory Visit (HOSPITAL_COMMUNITY): Payer: Self-pay

## 2021-05-13 DIAGNOSIS — H5213 Myopia, bilateral: Secondary | ICD-10-CM | POA: Diagnosis not present

## 2021-06-01 ENCOUNTER — Other Ambulatory Visit (HOSPITAL_COMMUNITY): Payer: Self-pay

## 2021-06-10 ENCOUNTER — Other Ambulatory Visit (HOSPITAL_COMMUNITY): Payer: Self-pay | Admitting: Internal Medicine

## 2021-06-10 DIAGNOSIS — Z1231 Encounter for screening mammogram for malignant neoplasm of breast: Secondary | ICD-10-CM

## 2021-06-18 ENCOUNTER — Ambulatory Visit (HOSPITAL_COMMUNITY)
Admission: RE | Admit: 2021-06-18 | Discharge: 2021-06-18 | Disposition: A | Discharge status: Home / Self Care | Payer: 59 | Source: Ambulatory Visit | Attending: Internal Medicine | Admitting: Internal Medicine

## 2021-06-18 ENCOUNTER — Other Ambulatory Visit: Payer: Self-pay

## 2021-06-18 DIAGNOSIS — Z1231 Encounter for screening mammogram for malignant neoplasm of breast: Secondary | ICD-10-CM | POA: Diagnosis not present

## 2021-06-22 DIAGNOSIS — D485 Neoplasm of uncertain behavior of skin: Secondary | ICD-10-CM | POA: Diagnosis not present

## 2021-06-22 DIAGNOSIS — D225 Melanocytic nevi of trunk: Secondary | ICD-10-CM | POA: Diagnosis not present

## 2021-06-22 DIAGNOSIS — Z1283 Encounter for screening for malignant neoplasm of skin: Secondary | ICD-10-CM | POA: Diagnosis not present

## 2021-07-12 ENCOUNTER — Ambulatory Visit: Payer: 59 | Admitting: Adult Health

## 2021-07-19 ENCOUNTER — Other Ambulatory Visit: Payer: Self-pay

## 2021-07-19 ENCOUNTER — Other Ambulatory Visit (HOSPITAL_COMMUNITY): Payer: Self-pay

## 2021-07-19 ENCOUNTER — Encounter: Payer: Self-pay | Admitting: Adult Health

## 2021-07-19 ENCOUNTER — Ambulatory Visit: Payer: 59 | Admitting: Adult Health

## 2021-07-19 VITALS — BP 150/71 | HR 66 | Ht 65.0 in | Wt 215.0 lb

## 2021-07-19 DIAGNOSIS — Z780 Asymptomatic menopausal state: Secondary | ICD-10-CM

## 2021-07-19 DIAGNOSIS — Z79890 Hormone replacement therapy (postmenopausal): Secondary | ICD-10-CM

## 2021-07-19 DIAGNOSIS — F419 Anxiety disorder, unspecified: Secondary | ICD-10-CM | POA: Diagnosis not present

## 2021-07-19 DIAGNOSIS — F32A Depression, unspecified: Secondary | ICD-10-CM | POA: Diagnosis not present

## 2021-07-19 MED ORDER — ESTRADIOL 0.1 MG/24HR TD PTTW
1.0000 | MEDICATED_PATCH | TRANSDERMAL | 3 refills | Status: DC
  Filled 2021-07-19: qty 24, 84d supply, fill #0
  Filled 2022-01-11: qty 24, 84d supply, fill #1

## 2021-07-19 MED ORDER — PROGESTERONE 200 MG PO CAPS
200.0000 mg | ORAL_CAPSULE | Freq: Every evening | ORAL | 3 refills | Status: DC
Start: 2021-07-19 — End: 2022-08-21
  Filled 2021-07-19: qty 90, 90d supply, fill #0
  Filled 2021-10-18: qty 90, 90d supply, fill #1
  Filled 2022-01-11: qty 90, 90d supply, fill #2
  Filled 2022-05-23: qty 90, 90d supply, fill #3

## 2021-07-19 MED ORDER — ESCITALOPRAM OXALATE 10 MG PO TABS
10.0000 mg | ORAL_TABLET | Freq: Every day | ORAL | 0 refills | Status: DC
  Filled 2021-07-19: qty 90, 90d supply, fill #0

## 2021-07-19 NOTE — Patient Instructions (Signed)
Menopause Menopause is the normal time of a woman's life when menstrual periods stop completely. It marks the natural end to a woman's ability to become pregnant. It can be defined as the absence of a menstrual period for 12 months without another medical cause. The transition to menopause (perimenopause) most often happens between the ages of 45 and 55, and can last for many years. During perimenopause, hormone levels change in your body, which can cause symptoms and affect your health. Menopause may increase your risk for: Weakened bones (osteoporosis), which causes fractures. Depression. Hardening and narrowing of the arteries (atherosclerosis), which can cause heart attacks and strokes. What are the causes? This condition is usually caused by a natural change in hormone levels that happens as you get older. The condition may also be caused by changes that are not natural, including: Surgery to remove both ovaries (surgical menopause). Side effects from some medicines, such as chemotherapy used to treat cancer (chemical menopause). What increases the risk? This condition is more likely to start at an earlier age if you have certain medical conditions or have undergone treatments, including: A tumor of the pituitary gland in the brain. A disease that affects the ovaries and hormones. Certain cancer treatments, such as chemotherapy or hormone therapy, or radiation therapy on the pelvis. Heavy smoking and excessive alcohol use. Family history of early menopause. This condition is also more likely to develop earlier in women who are very thin. What are the signs or symptoms? Symptoms of this condition include: Hot flashes. Irregular menstrual periods. Night sweats. Changes in feelings about sex. This could be a decrease in sex drive or an increased discomfort around your sexuality. Vaginal dryness and thinning of the vaginal walls. This may cause painful sex. Dryness of the skin and  development of wrinkles. Headaches. Problems sleeping (insomnia). Mood swings or irritability. Memory problems. Weight gain. Hair growth on the face and chest. Bladder infections or problems with urinating. How is this diagnosed? This condition is diagnosed based on your medical history, a physical exam, your age, your menstrual history, and your symptoms. Hormone tests may also be done. How is this treated? In some cases, no treatment is needed. You and your health care provider should make a decision together about whether treatment is necessary. Treatment will be based on your individual condition and preferences. Treatment for this condition focuses on managing symptoms. Treatment may include: Menopausal hormone therapy (MHT). Medicines to treat specific symptoms or complications. Acupuncture. Vitamin or herbal supplements. Before starting treatment, make sure to let your health care provider know if you have a personal or family history of these conditions: Heart disease. Breast cancer. Blood clots. Diabetes. Osteoporosis. Follow these instructions at home: Lifestyle Do not use any products that contain nicotine or tobacco, such as cigarettes, e-cigarettes, and chewing tobacco. If you need help quitting, ask your health care provider. Get at least 30 minutes of physical activity on 5 or more days each week. Avoid alcoholic and caffeinated beverages, as well as spicy foods. This may help prevent hot flashes. Get 7-8 hours of sleep each night. If you have hot flashes, try: Dressing in layers. Avoiding things that may trigger hot flashes, such as spicy food, warm places, or stress. Taking slow, deep breaths when a hot flash starts. Keeping a fan in your home and office. Find ways to manage stress, such as deep breathing, meditation, or journaling. Consider going to group therapy with other women who are having menopause symptoms. Ask your health care   provider about recommended  group therapy meetings. ?Eating and drinking ? ?Eat a healthy, balanced diet that contains whole grains, lean protein, low-fat dairy, and plenty of fruits and vegetables. ?Your health care provider may recommend adding more soy to your diet. Foods that contain soy include tofu, tempeh, and soy milk. ?Eat plenty of foods that contain calcium and vitamin D for bone health. Items that are rich in calcium include low-fat milk, yogurt, beans, almonds, sardines, broccoli, and kale. ?Medicines ?Take over-the-counter and prescription medicines only as told by your health care provider. ?Talk with your health care provider before starting any herbal supplements. If prescribed, take vitamins and supplements as told by your health care provider. ?General instructions ? ?Keep track of your menstrual periods, including: ?When they occur. ?How heavy they are and how long they last. ?How much time passes between periods. ?Keep track of your symptoms, noting when they start, how often you have them, and how long they last. ?Use vaginal lubricants or moisturizers to help with vaginal dryness and improve comfort during sex. ?Keep all follow-up visits. This is important. This includes any group therapy or counseling. ?Contact a health care provider if: ?You are still having menstrual periods after age 68. ?You have pain during sex. ?You have not had a period for 12 months and you develop vaginal bleeding. ?Get help right away if you have: ?Severe depression. ?Excessive vaginal bleeding. ?Pain when you urinate. ?A fast or irregular heartbeat (palpitations). ?Severe headaches. ?Abdominal pain or severe indigestion. ?Summary ?Menopause is a normal time of life when menstrual periods stop completely. It is usually defined as the absence of a menstrual period for 12 months without another medical cause. ?The transition to menopause (perimenopause) most often happens between the ages of 19 and 31 and can last for several years. ?Symptoms  can be managed through medicines, lifestyle changes, and complementary therapies such as acupuncture. ?Eat a balanced diet that is rich in nutrients to promote bone health and heart health and to manage symptoms during menopause. ?This information is not intended to replace advice given to you by your health care provider. Make sure you discuss any questions you have with your health care provider. ?Document Revised: 01/31/2020 Document Reviewed: 10/17/2019 ?Elsevier Patient Education ? [place]. ? ?

## 2021-07-19 NOTE — Progress Notes (Signed)
?  Subjective:  ?  ? Patient ID: Gina Whitaker, female   DOB: 07/27/1965, 56 y.o.   MRN: [id number] ? ?HPI ?Gina Whitaker is a 56 year old white female, married, PM, in requesting refills on estrogen patch and also Prometrium which she has not taken for about 6 months. She is complaining of feeling anxious and overwhelmed, can't think straight, not sleeping well and has gained weight. She had crying episode recently ?She has decreased libido too, and vaginal dryness. She has rx for testosterone but has not filled.  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 Her husband is with her.  ?She had ablation 07/2018, and pap before that in [place].  ?PCP is Dr Willey Blade. ? ? ?Review of Systems ?See HPI for positives ?Denies any vaginal bleeding or frequent hot flashes ? ?  Reviewed past medical,surgical, social and family history. Reviewed medications and allergies.  ?Objective:  ? Physical Exam ?BP (!) 150/71 (BP Location: Right Arm, Patient Position: Sitting, Cuff Size: Normal)   Pulse 66   Ht '5\' 5"'$  (1.651 m)   Wt 215 lb (97.5 kg)   LMP 10/13/2019 (Approximate)   BMI 35.78 kg/m?   ?  Skin warm and dry. Lungs: clear to ausculation bilaterally. Cardiovascular: regular rate and rhythm.  ? Upstream - 07/19/21 0919   ? ?  ? Pregnancy Intention Screening  ? Does the patient want to become pregnant in the next year? N/A   ? Does the patient's partner want to become pregnant in the next year? N/A   ? Would the patient like to discuss contraceptive options today? N/A   ?  ? Contraception Wrap Up  ? Current Method --   PM  ? End Method --   PM  ? Contraception Counseling Provided No   ? ?  ?  ? ?  ?  ?Assessment:  ?   ?1. Hormone replacement therapy (HRT) ?Will refill Vivelle dot 0.1 mg and Prometrium 200 mg  ?Meds ordered this encounter  ?Medications  ? estradiol (VIVELLE-DOT) 0.1 MG/24HR patch  ?  Sig: Place 1 patch (0.1 mg total) onto the skin 2 (two) times a week.  ?  Dispense:  24 patch  ?  Refill:  3  ?  Order Specific Question:   Supervising  Provider  ?  Answer:   Tania Ade H [[id number]]  ? progesterone (PROMETRIUM) 200 MG capsule  ?  Sig: Take 1 capsule (200 mg total) by mouth at bedtime.  ?  Dispense:  30 capsule  ?  Refill:  11  ?  Order Specific Question:   Supervising Provider  ?  Answer:   Tania Ade H [[id number]]  ? escitalopram (LEXAPRO) 10 MG tablet  ?  Sig: Take 1 tablet (10 mg total) by mouth daily.  ?  Dispense:  90 tablet  ?  Refill:  0  ?  Order Specific Question:   Supervising Provider  ?  Answer:   Tania Ade H [[id number]]  ?  ? ?2. Anxiety and depression ?Will start on lexapro 10 mg daily ? ?3. Menopause ?Review handout  ?   ?Plan:  ?   ?Return in 8 weeks for pap and physical and ROS ?   ?

## 2021-07-22 ENCOUNTER — Other Ambulatory Visit (HOSPITAL_COMMUNITY): Payer: Self-pay

## 2021-09-20 ENCOUNTER — Other Ambulatory Visit (HOSPITAL_COMMUNITY)
Admission: RE | Admit: 2021-09-20 | Discharge: 2021-09-20 | Disposition: A | Discharge status: Home / Self Care | Payer: 59 | Source: Ambulatory Visit | Attending: Adult Health | Admitting: Adult Health

## 2021-09-20 ENCOUNTER — Other Ambulatory Visit (HOSPITAL_COMMUNITY): Payer: Self-pay

## 2021-09-20 ENCOUNTER — Ambulatory Visit (INDEPENDENT_AMBULATORY_CARE_PROVIDER_SITE_OTHER): Payer: 59 | Admitting: Adult Health

## 2021-09-20 ENCOUNTER — Encounter: Payer: Self-pay | Admitting: Adult Health

## 2021-09-20 VITALS — BP 136/76 | HR 62 | Ht 65.0 in | Wt 214.0 lb

## 2021-09-20 DIAGNOSIS — Z1211 Encounter for screening for malignant neoplasm of colon: Secondary | ICD-10-CM

## 2021-09-20 DIAGNOSIS — F419 Anxiety disorder, unspecified: Secondary | ICD-10-CM | POA: Diagnosis not present

## 2021-09-20 DIAGNOSIS — Z01419 Encounter for gynecological examination (general) (routine) without abnormal findings: Secondary | ICD-10-CM

## 2021-09-20 DIAGNOSIS — N393 Stress incontinence (female) (male): Secondary | ICD-10-CM | POA: Insufficient documentation

## 2021-09-20 DIAGNOSIS — Z780 Asymptomatic menopausal state: Secondary | ICD-10-CM

## 2021-09-20 DIAGNOSIS — F32A Depression, unspecified: Secondary | ICD-10-CM

## 2021-09-20 DIAGNOSIS — Z79890 Hormone replacement therapy (postmenopausal): Secondary | ICD-10-CM

## 2021-09-20 LAB — HEMOCCULT GUIAC POC 1CARD (OFFICE): Fecal Occult Blood, POC: NEGATIVE

## 2021-09-20 MED ORDER — ESCITALOPRAM OXALATE 10 MG PO TABS
10.0000 mg | ORAL_TABLET | Freq: Every day | ORAL | 3 refills | Status: DC
  Filled 2021-09-20 – 2021-10-18 (×2): qty 90, 90d supply, fill #0
  Filled 2022-01-11: qty 90, 90d supply, fill #1
  Filled 2022-04-13: qty 90, 90d supply, fill #2
  Filled 2022-07-06: qty 90, 90d supply, fill #3

## 2021-09-20 NOTE — Progress Notes (Signed)
Patient ID: Gina Whitaker, female   DOB: 1965/09/25, 56 y.o.   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 She is feeling much better, and sleeping great, only complaint period started back, had in March for 12 days and then heavy in April. Libido is better.  ?She went to 2 12 hour days at work and that is great.  ? ?PCP is Dr Willey Blade. ? ?Current Medications, Allergies, Past Medical History, Past Surgical History, Family History and Social History were reviewed in [employer] record.   ? ? ?Review of Systems: ?Patient denies any headaches, hearing loss, fatigue, blurred vision, shortness of breath, chest pain, abdominal pain, problems with bowel movements, or intercourse. No joint pain or mood swings.  ?Has some stress urinary incontinence ?Had period March, 12 days and light and then in April and was heavy ? ? ? ?Physical Exam:BP 136/76 (BP Location: Right Arm, Patient Position: Sitting, Cuff Size: Normal)   Pulse 62   Ht '5\' 5"'$  (1.651 m)   Wt 214 lb (97.1 kg)   LMP 10/13/2019 (Approximate)   BMI 35.61 kg/m?   ?General:  Well developed, well nourished, no acute distress ?Skin:  Warm and dry ?Neck:  Midline trachea, normal thyroid, good ROM, no lymphadenopathy ?Lungs; Clear to auscultation bilaterally ?Breast:  No dominant palpable mass, retraction, or nipple discharge ?Cardiovascular: Regular rate and rhythm ?Abdomen:  Soft, non tender, no hepatosplenomegaly ?Pelvic:  External genitalia is normal in appearance, no lesions.  The vagina is normal in appearance. Urethra has no lesions or masses. The cervix is bulbous and smooth, pap with HR HPV genotyping performed.  Uterus is felt to be normal size, shape, and contour.  No adnexal masses or tenderness noted.Bladder is non tender, no masses felt. ?Rectal: Good sphincter tone, no polyps, or hemorrhoids felt.   Hemoccult negative.+rectocele ?Extremities/musculoskeletal:  No swelling or varicosities noted, no clubbing or cyanosis ?Psych:  No mood changes, alert and cooperative,seems happy ?AA is 0 ?Fall risk is low ? ?  09/20/2021  ?  9:40 AM 07/19/2021  ?  9:33 AM 11/05/2019  ?  8:45 AM  ?Depression screen PHQ 2/9  ?Decreased Interest 0 1 0  ?Down, Depressed, Hopeless 0 1 0  ?PHQ - 2 Score 0 2 0  ?Altered sleeping 0 3 1  ?Tired, decreased energy '1 3 1  '$ ?Change in appetite 0 2 0  ?Feeling bad or failure about yourself  1 3 0  ?Trouble concentrating 1 3 0  ?Moving slowly or fidgety/restless 0 3 0  ?Suicidal thoughts 0 0 0  ?PHQ-9 Score '3 19 2  '$ ?Difficult doing work/chores  Very difficult Not difficult at all  ?  ? ?  09/20/2021  ?  9:40 AM 07/19/2021  ?  9:36 AM 11/05/2019  ?  8:46 AM  ?GAD 7 : Generalized Anxiety Score  ?Nervous, Anxious, on Edge '1 1 1  '$ ?Control/stop worrying 0 3 1  ?Worry too much - different things 0 3 0  ?Trouble relaxing 0 3 0  ?Restless 0 1 0  ?Easily annoyed or irritable 1 3 0  ?Afraid - awful might happen 0 3 1  ?Total GAD 7 Score '2 17 3  '$ ?Anxiety Difficulty   Not difficult at all  ? ? Upstream - 09/20/21 0957   ? ?  ? Pregnancy Intention Screening  ?  Does the patient want to become pregnant in the next year? No   ? Does the patient's partner want to become pregnant in the next year? No   ? Would the patient like to discuss contraceptive options today? No   ?  ? Contraception Wrap Up  ? Current Method --   PM  ? End Method --   PM  ? Contraception Counseling Provided No   ? ?  ?  ? ?  ?  ? Examination chaperoned by Celene Squibb LPN ? ? ?Impression and Plan: ? ?1. Encounter for gynecological examination with Papanicolaou smear of cervix ?Pap sent ?Physical in 1 year ?Pap in 3 if normal ?Labs with PCP ?Mammogram yearly ?Colonoscopy per GI ? ?2. Hormone replacement therapy (HRT) ?Will continue vivelle dot 0.1 mg and will change how taking Prometrium next 3 months, take 400 mg day 1-10 of the month and will  follow up in 3 months  ?She has refills  ? ?3. Anxiety and depression ?Continue lexapro 10 mg 1 daily ?Meds ordered this encounter  ?Medications  ? escitalopram (LEXAPRO) 10 MG tablet  ?  Sig: Take 1 tablet (10 mg total) by mouth daily.  ?  Dispense:  90 tablet  ?  Refill:  3  ?  Order Specific Question:   Supervising Provider  ?  Answer:   Tania Ade H [[id number]]  ?  ? ?4. Menopause ? ?5. Encounter for screening fecal occult blood testing ?Hemoccult negative  ? ?6. SUI (stress urinary incontinence, female) ?She declines meds ?Decrease caffeine ?Do keglels ? Can get pelvic floor PT if desired  ? ? ?  ?  ?

## 2021-09-22 LAB — CYTOLOGY - PAP
Adequacy: ABSENT
Comment: NEGATIVE
Diagnosis: NEGATIVE
High risk HPV: NEGATIVE

## 2021-09-28 ENCOUNTER — Other Ambulatory Visit (HOSPITAL_COMMUNITY)
Admission: RE | Admit: 2021-09-28 | Discharge: 2021-09-28 | Disposition: A | Discharge status: Home / Self Care | Payer: 59 | Source: Ambulatory Visit | Attending: Internal Medicine | Admitting: Internal Medicine

## 2021-09-28 DIAGNOSIS — I10 Essential (primary) hypertension: Secondary | ICD-10-CM | POA: Insufficient documentation

## 2021-09-28 LAB — CBC WITH DIFFERENTIAL/PLATELET
Abs Immature Granulocytes: 0.01 10*3/uL (ref 0.00–0.07)
Basophils Absolute: 0.1 10*3/uL (ref 0.0–0.1)
Basophils Relative: 1 %
Eosinophils Absolute: 0.1 10*3/uL (ref 0.0–0.5)
Eosinophils Relative: 2 %
HCT: 41.6 % (ref 36.0–46.0)
Hemoglobin: 13.3 g/dL (ref 12.0–15.0)
Immature Granulocytes: 0 %
Lymphocytes Relative: 26 %
Lymphs Abs: 1.7 10*3/uL (ref 0.7–4.0)
MCH: 28.1 pg (ref 26.0–34.0)
MCHC: 32 g/dL (ref 30.0–36.0)
MCV: 87.9 fL (ref 80.0–100.0)
Monocytes Absolute: 0.4 10*3/uL (ref 0.1–1.0)
Monocytes Relative: 7 %
Neutro Abs: 4.2 10*3/uL (ref 1.7–7.7)
Neutrophils Relative %: 64 %
Platelets: 288 10*3/uL (ref 150–400)
RBC: 4.73 MIL/uL (ref 3.87–5.11)
RDW: 13.2 % (ref 11.5–15.5)
WBC: 6.5 10*3/uL (ref 4.0–10.5)
nRBC: 0 % (ref 0.0–0.2)

## 2021-09-28 LAB — COMPREHENSIVE METABOLIC PANEL
ALT: 15 U/L (ref 0–44)
AST: 14 U/L — ABNORMAL LOW (ref 15–41)
Albumin: 4.1 g/dL (ref 3.5–5.0)
Alkaline Phosphatase: 68 U/L (ref 38–126)
Anion gap: 4 — ABNORMAL LOW (ref 5–15)
BUN: 12 mg/dL (ref 6–20)
CO2: 29 mmol/L (ref 22–32)
Calcium: 8.8 mg/dL — ABNORMAL LOW (ref 8.9–10.3)
Chloride: 104 mmol/L (ref 98–111)
Creatinine, Ser: 0.78 mg/dL (ref 0.44–1.00)
GFR, Estimated: >60 mL/min (ref >60)
Glucose, Bld: 101 mg/dL — ABNORMAL HIGH (ref 70–99)
Potassium: 4 mmol/L (ref 3.5–5.1)
Sodium: 137 mmol/L (ref 135–145)
Total Bilirubin: 0.6 mg/dL (ref 0.3–1.2)
Total Protein: 7.1 g/dL (ref 6.5–8.1)

## 2021-09-28 LAB — LIPID PANEL
Cholesterol: 227 mg/dL — ABNORMAL HIGH (ref 0–200)
HDL: 40 mg/dL — ABNORMAL LOW (ref >40)
LDL Cholesterol: 156 mg/dL — ABNORMAL HIGH (ref 0–99)
Total CHOL/HDL Ratio: 5.7 RATIO
Triglycerides: 157 mg/dL — ABNORMAL HIGH (ref <150)
VLDL: 31 mg/dL (ref 0–40)

## 2021-09-28 LAB — HEMOGLOBIN A1C
Hgb A1c MFr Bld: 6 % — ABNORMAL HIGH (ref 4.8–5.6)
Mean Plasma Glucose: 125.5 mg/dL

## 2021-09-28 LAB — URINALYSIS, ROUTINE W REFLEX MICROSCOPIC
Bilirubin Urine: NEGATIVE
Glucose, UA: NEGATIVE mg/dL
Ketones, ur: NEGATIVE mg/dL
Leukocytes,Ua: NEGATIVE
Nitrite: NEGATIVE
Protein, ur: NEGATIVE mg/dL
Specific Gravity, Urine: 1.015 (ref 1.005–1.030)
pH: 6 (ref 5.0–8.0)

## 2021-10-04 DIAGNOSIS — I10 Essential (primary) hypertension: Secondary | ICD-10-CM | POA: Diagnosis not present

## 2021-10-04 DIAGNOSIS — R7309 Other abnormal glucose: Secondary | ICD-10-CM | POA: Diagnosis not present

## 2021-10-04 DIAGNOSIS — E785 Hyperlipidemia, unspecified: Secondary | ICD-10-CM | POA: Diagnosis not present

## 2021-10-04 DIAGNOSIS — J4520 Mild intermittent asthma, uncomplicated: Secondary | ICD-10-CM | POA: Diagnosis not present

## 2021-10-04 DIAGNOSIS — Z23 Encounter for immunization: Secondary | ICD-10-CM | POA: Diagnosis not present

## 2021-10-18 ENCOUNTER — Other Ambulatory Visit (HOSPITAL_COMMUNITY): Payer: Self-pay

## 2021-11-19 ENCOUNTER — Other Ambulatory Visit: Payer: Self-pay | Admitting: Adult Health

## 2021-11-19 MED ORDER — MEGESTROL ACETATE 40 MG PO TABS: ORAL_TABLET | ORAL | 1 refills | Status: DC

## 2021-11-19 NOTE — Progress Notes (Signed)
Rx sent for megace to stop bleeding

## 2021-12-21 ENCOUNTER — Ambulatory Visit (INDEPENDENT_AMBULATORY_CARE_PROVIDER_SITE_OTHER): Payer: 59 | Admitting: Adult Health

## 2021-12-21 ENCOUNTER — Encounter: Payer: Self-pay | Admitting: Adult Health

## 2021-12-21 VITALS — BP 117/61 | HR 71 | Ht 65.0 in | Wt 213.0 lb

## 2021-12-21 DIAGNOSIS — R58 Hemorrhage, not elsewhere classified: Secondary | ICD-10-CM | POA: Insufficient documentation

## 2021-12-21 DIAGNOSIS — Z79890 Hormone replacement therapy (postmenopausal): Secondary | ICD-10-CM

## 2021-12-21 DIAGNOSIS — Z780 Asymptomatic menopausal state: Secondary | ICD-10-CM | POA: Diagnosis not present

## 2021-12-21 NOTE — Progress Notes (Signed)
  Subjective:     Patient ID: Gina Whitaker, female   DOB: 10/13/1965, 56 y.o.   MRN: [id number]  HPI Gina Whitaker is a 56 year old white female, married, PM, on HRT, back in follow up on having heavy bleeding on HRT, stopped it and took megace which stopped the bleeding and has resumed HRT. Feels much better when taking HRT. Has some shortness of breath esp with exertion. She had ablation years 2020.  Lab Results  Component Value Date   DIAGPAP  09/20/2021    - Negative for intraepithelial lesion or malignancy (NILM)   [place] Negative 09/20/2021    PCP is Dr Willey Blade.  Review of Systems Bleeding on HRT has stopped Feels better on HRT +shortness of breath  Reviewed past medical,surgical, social and family history. Reviewed medications and allergies.     Objective:   Physical Exam BP 117/61 (BP Location: Right Arm, Patient Position: Sitting, Cuff Size: Normal)   Pulse 71   Ht '5\' 5"'$  (1.651 m)   Wt 213 lb (96.6 kg)   LMP 09/21/2019   BMI 35.45 kg/m     Skin warm and dry.Lungs: clear to ausculation bilaterally. Cardiovascular: regular rate and rhythm.   Upstream - 12/21/21 0842       Pregnancy Intention Screening   Does the patient want to become pregnant in the next year? No    Does the patient's partner want to become pregnant in the next year? No    Would the patient like to discuss contraceptive options today? No      Contraception Wrap Up   Current Method --   PM   End Method --   PM   Contraception Counseling Provided No             Assessment:     1. Hormone replacement therapy (HRT) Will continue vivelle dot 0.1 mg and Prometrium 400 mg days 1-10 of the month  2. Menopause  3. Bleeding Check CBC and TSH  Will get pelvic US 12/22/21 at 1:30 pm at [facility] and talk when results back     Plan:     Follow up prn

## 2021-12-22 ENCOUNTER — Ambulatory Visit (HOSPITAL_COMMUNITY)
Admission: RE | Admit: 2021-12-22 | Discharge: 2021-12-22 | Disposition: A | Discharge status: Home / Self Care | Payer: 59 | Source: Ambulatory Visit | Attending: Adult Health | Admitting: Adult Health

## 2021-12-22 ENCOUNTER — Other Ambulatory Visit (HOSPITAL_COMMUNITY)
Admission: RE | Admit: 2021-12-22 | Discharge: 2021-12-22 | Disposition: A | Discharge status: Home / Self Care | Payer: 59 | Source: Ambulatory Visit | Attending: Adult Health | Admitting: Adult Health

## 2021-12-22 DIAGNOSIS — N950 Postmenopausal bleeding: Secondary | ICD-10-CM | POA: Diagnosis not present

## 2021-12-22 DIAGNOSIS — Z79890 Hormone replacement therapy (postmenopausal): Secondary | ICD-10-CM | POA: Diagnosis not present

## 2021-12-22 DIAGNOSIS — R58 Hemorrhage, not elsewhere classified: Secondary | ICD-10-CM | POA: Insufficient documentation

## 2021-12-22 DIAGNOSIS — D259 Leiomyoma of uterus, unspecified: Secondary | ICD-10-CM | POA: Insufficient documentation

## 2021-12-22 LAB — CBC
HCT: 37.6 % (ref 36.0–46.0)
Hemoglobin: 12.2 g/dL (ref 12.0–15.0)
MCH: 28.9 pg (ref 26.0–34.0)
MCHC: 32.4 g/dL (ref 30.0–36.0)
MCV: 89.1 fL (ref 80.0–100.0)
Platelets: 295 10*3/uL (ref 150–400)
RBC: 4.22 MIL/uL (ref 3.87–5.11)
RDW: 13.3 % (ref 11.5–15.5)
WBC: 7.8 10*3/uL (ref 4.0–10.5)
nRBC: 0 % (ref 0.0–0.2)

## 2021-12-22 LAB — TSH: TSH: 1.759 u[IU]/mL (ref 0.350–4.500)

## 2021-12-24 ENCOUNTER — Encounter: Payer: Self-pay | Admitting: Adult Health

## 2021-12-24 DIAGNOSIS — D219 Benign neoplasm of connective and other soft tissue, unspecified: Secondary | ICD-10-CM | POA: Insufficient documentation

## 2021-12-24 HISTORY — DX: Benign neoplasm of connective and other soft tissue, unspecified: D21.9

## 2022-01-11 ENCOUNTER — Other Ambulatory Visit: Payer: Self-pay | Admitting: Adult Health

## 2022-01-11 ENCOUNTER — Other Ambulatory Visit (HOSPITAL_COMMUNITY): Payer: Self-pay

## 2022-01-11 DIAGNOSIS — K5792 Diverticulitis of intestine, part unspecified, without perforation or abscess without bleeding: Secondary | ICD-10-CM | POA: Diagnosis not present

## 2022-01-31 ENCOUNTER — Other Ambulatory Visit: Payer: Self-pay | Admitting: Adult Health

## 2022-02-14 ENCOUNTER — Other Ambulatory Visit (HOSPITAL_COMMUNITY): Payer: Self-pay

## 2022-02-14 ENCOUNTER — Encounter: Payer: Self-pay | Admitting: Obstetrics & Gynecology

## 2022-02-14 ENCOUNTER — Encounter (HOSPITAL_COMMUNITY): Payer: Self-pay | Admitting: Hematology

## 2022-02-14 ENCOUNTER — Ambulatory Visit: Payer: 59 | Admitting: Obstetrics & Gynecology

## 2022-02-14 VITALS — BP 136/67 | HR 68 | Ht 65.0 in | Wt 214.0 lb

## 2022-02-14 DIAGNOSIS — N938 Other specified abnormal uterine and vaginal bleeding: Secondary | ICD-10-CM | POA: Diagnosis not present

## 2022-02-14 DIAGNOSIS — D219 Benign neoplasm of connective and other soft tissue, unspecified: Secondary | ICD-10-CM | POA: Diagnosis not present

## 2022-02-14 MED ORDER — CONJ ESTROGENS-BAZEDOXIFENE 0.45-20 MG PO TABS
1.0000 | ORAL_TABLET | Freq: Every day | ORAL | 3 refills | Status: DC
  Filled 2022-02-14: qty 90, 90d supply, fill #0
  Filled 2022-05-23: qty 90, 90d supply, fill #1
  Filled 2022-08-21 – 2022-09-07 (×4): qty 90, 90d supply, fill #2
  Filled 2022-11-15: qty 90, 90d supply, fill #3

## 2022-02-14 NOTE — Progress Notes (Signed)
Chief Complaint  Patient presents with   Menorrhagia    Takes megace, bleeding stopped 2 days ago. Stopped her HRT. Wants a definitive plan for treatment.       56 y.o. [id number] Patient's last menstrual period was 09/21/2019. The current method of family planning is post menopausal status and ablation.  Outpatient Encounter Medications as of 02/14/2022  Medication Sig   acyclovir (ZOVIRAX) 400 MG tablet Take 400 mg by mouth 5 (five) times daily as needed (fever blisters).   albuterol (VENTOLIN HFA) 108 (90 Base) MCG/ACT inhaler Inhale into the lungs every 4 (four) hours as needed for wheezing or shortness of breath.   cetirizine (ZYRTEC) 10 MG tablet Take 10 mg by mouth daily.   Conj Estrogens-Bazedoxifene 0.45-20 MG TABS Take 1 tablet by mouth daily.   diltiazem (CARDIZEM CD) 180 MG 24 hr capsule Take 1 capsule (180 mg total) by mouth daily.   docusate sodium (COLACE) 100 MG capsule Take 100 mg by mouth daily.   escitalopram (LEXAPRO) 10 MG tablet Take 1 tablet (10 mg total) by mouth daily.   fluticasone (FLONASE) 50 MCG/ACT nasal spray Place 2 sprays into both nostrils daily.   megestrol (MEGACE) 40 MG tablet TAKE 3 TABLETS BY MOUTH FOR FIVE DAYS, THEN TAKE 2 TABLETS BY MOUTH FOR FIVE DAYS, THEN TAKE 1 TABLET BY MOUTH EVERY DAY AFTERWARDS   rizatriptan (MAXALT) 10 MG tablet Take 10 mg by mouth daily as needed for migraine.   estradiol (VIVELLE-DOT) 0.1 MG/24HR patch Place 1 patch (0.1 mg total) onto the skin 2 (two) times a week. (Patient not taking: Reported on 02/14/2022)   Multiple Vitamins-Minerals (ADULT GUMMY PO) Take 2 capsules by mouth daily. (Patient not taking: Reported on 09/20/2021)   progesterone (PROMETRIUM) 200 MG capsule Take 1 capsule (200 mg total) by mouth Nightly. (Patient not taking: Reported on 02/14/2022)   No facility-administered encounter medications on file as of 02/14/2022.    Subjective Pt is having difficulty transitioning with her HRT She is having  bleeding, prolonged variable with dysmenorrhea Stopped her patches 4 weeks ago and is having vasomotor symtpoms Prometrium works great for sleep Bled essentially 3+ weeks straight on megestrol  Has a complicating lower segment fibroid, I viewed the images Her endometrium is not impressive  Past Medical History:  Diagnosis Date   Asthma    Diverticulitis    Fibroid 12/24/2021   Hypertension    Sleep apnea     Past Surgical History:  Procedure Laterality Date   APPENDECTOMY     CHOLECYSTECTOMY     COLONOSCOPY WITH PROPOFOL N/A 05/13/2020   Procedure: COLONOSCOPY WITH PROPOFOL;  Surgeon: Rogene Houston, MD;  Location: [facility];  Service: Endoscopy;  Laterality: N/A;  730   endometrial ablation     NECK SURGERY  2017   t lift and fixation  C4 C5 C6 C7    TEMPOROMANDIBULAR JOINT SURGERY     twice bilateral    OB History     Gravida  3   Para  2   Term  1   Preterm  1   AB  1   Living  2      SAB  1   IAB      Ectopic      Multiple      Live Births  2           Allergies  Allergen Reactions   Hydrocodone Rash and Swelling  Latex Swelling and Rash   Tramadol     Rash and facial swelling   Erythromycin Nausea And Vomiting   Watermelon [Citrullus Vulgaris]     Oral itch syndrome    Codeine Itching and Rash   Morphine Itching, Swelling and Rash   Penicillins Rash    Social History   Socioeconomic History   Marital status: Married    Spouse name: Not on file   Number of children: 2   Years of education: Not on file   Highest education level: Not on file  Occupational History   Not on file  Tobacco Use   Smoking status: Former    Packs/day: 3.00    Years: 6.00    Total pack years: 18.00    Types: Cigarettes    Quit date: 48    Years since quitting: 29.7   Smokeless tobacco: Never  Vaping Use   Vaping Use: Never used  Substance and Sexual Activity   Alcohol use: Never   Drug use: Never   Sexual activity: Yes    Birth  control/protection: Surgical, Post-menopausal    Comment: ablation  Other Topics Concern   Not on file  Social History Narrative   Not on file   Social Determinants of Health   Financial Resource Strain: Low Risk  (09/20/2021)   Overall Financial Resource Strain (CARDIA)    Difficulty of Paying Living Expenses: Not hard at all  Food Insecurity: No Food Insecurity (09/20/2021)   Hunger Vital Sign    Worried About Running Out of Food in the Last Year: Never true    Ran Out of Food in the Last Year: Never true  Transportation Needs: No Transportation Needs (09/20/2021)   PRAPARE - Hydrologist (Medical): No    Lack of Transportation (Non-Medical): No  Physical Activity: Insufficiently Active (09/20/2021)   Exercise Vital Sign    Days of Exercise per Week: 2 days    Minutes of Exercise per Session: 20 min  Stress: No Stress Concern Present (09/20/2021)   [place]    Feeling of Stress : Only a little  Social Connections: Moderately Isolated (09/20/2021)   Social Connection and Isolation Panel [NHANES]    Frequency of Communication with Friends and Family: Once a week    Frequency of Social Gatherings with Friends and Family: Once a week    Attends Religious Services: 1 to 4 times per year    Active Member of [employer] or Organizations: No    Attends Music therapist: Never    Marital Status: Married    Family History  Problem Relation Age of Onset   Congestive Heart Failure Paternal Grandfather    Heart attack Paternal Grandfather    Dementia Paternal Grandfather    Basal cell carcinoma Paternal Grandfather    Cancer Paternal Grandmother        pancreatic   Aneurysm Maternal Grandmother    Hypertension Maternal Grandmother    CAD Maternal Grandmother    Heart attack Maternal Grandfather    Mental illness Maternal Grandfather    Hypertension Father    Basal cell carcinoma Father     Cancer Father        prostate   Glaucoma Father    Hypertension Mother    Melanoma Mother    High Cholesterol Mother    Irritable bowel syndrome Mother    Iron deficiency Mother    Arthritis Mother  High Cholesterol Brother    Hypertension Brother    Other Brother    Asthma Daughter    Polycystic ovary syndrome Daughter     Medications:       Current Outpatient Medications:    acyclovir (ZOVIRAX) 400 MG tablet, Take 400 mg by mouth 5 (five) times daily as needed (fever blisters)., Disp: , Rfl:    albuterol (VENTOLIN HFA) 108 (90 Base) MCG/ACT inhaler, Inhale into the lungs every 4 (four) hours as needed for wheezing or shortness of breath., Disp: , Rfl:    cetirizine (ZYRTEC) 10 MG tablet, Take 10 mg by mouth daily., Disp: , Rfl:    Conj Estrogens-Bazedoxifene 0.45-20 MG TABS, Take 1 tablet by mouth daily., Disp: 90 tablet, Rfl: 3   diltiazem (CARDIZEM CD) 180 MG 24 hr capsule, Take 1 capsule (180 mg total) by mouth daily., Disp: 90 capsule, Rfl: 4   docusate sodium (COLACE) 100 MG capsule, Take 100 mg by mouth daily., Disp: , Rfl:    escitalopram (LEXAPRO) 10 MG tablet, Take 1 tablet (10 mg total) by mouth daily., Disp: 90 tablet, Rfl: 3   fluticasone (FLONASE) 50 MCG/ACT nasal spray, Place 2 sprays into both nostrils daily., Disp: , Rfl:    megestrol (MEGACE) 40 MG tablet, TAKE 3 TABLETS BY MOUTH FOR FIVE DAYS, THEN TAKE 2 TABLETS BY MOUTH FOR FIVE DAYS, THEN TAKE 1 TABLET BY MOUTH EVERY DAY AFTERWARDS, Disp: 45 tablet, Rfl: 1   rizatriptan (MAXALT) 10 MG tablet, Take 10 mg by mouth daily as needed for migraine., Disp: , Rfl:    estradiol (VIVELLE-DOT) 0.1 MG/24HR patch, Place 1 patch (0.1 mg total) onto the skin 2 (two) times a week. (Patient not taking: Reported on 02/14/2022), Disp: 24 patch, Rfl: 3   Multiple Vitamins-Minerals (ADULT GUMMY PO), Take 2 capsules by mouth daily. (Patient not taking: Reported on 09/20/2021), Disp: , Rfl:    progesterone (PROMETRIUM) 200 MG capsule,  Take 1 capsule (200 mg total) by mouth Nightly. (Patient not taking: Reported on 02/14/2022), Disp: 90 capsule, Rfl: 3  Objective Blood pressure 136/67, pulse 68, height '5\' 5"'$  (1.651 m), weight 214 lb (97.1 kg), last menstrual period 09/21/2019.  WDWN NAD  Pertinent ROS No burning with urination, frequency or urgency No nausea, vomiting or diarrhea Nor fever chills or other constitutional symptoms   Labs or studies US PELVIC COMPLETE WITH TRANSVAGINAL  Result Date: 12/22/2021 CLINICAL DATA:  Postmenopausal bleeding EXAM: TRANSABDOMINAL AND TRANSVAGINAL ULTRASOUND OF PELVIS TECHNIQUE: Both transabdominal and transvaginal ultrasound examinations of the pelvis were performed. Transabdominal technique was performed for global imaging of the pelvis including uterus, ovaries, adnexal regions, and pelvic cul-de-sac. It was necessary to proceed with endovaginal exam following the transabdominal exam to visualize the endometrium and ovaries. COMPARISON:  None Available. FINDINGS: Uterus Measurements: 12.6 x 5.9 x 7.3 cm = volume: 28.3 mL. There is 3.2 x 5.4 x 6.6 cm fibroid in the lower uterine segment. Endometrium Thickness: 4.4 mm within the fundus. Endometrial stripe in the lower uterine segment is not optimally visualized. Right ovary Measurements: 3.1 x 3 x 3.6 cm = volume: 22.5 mL. Normal appearance/no adnexal mass. Left ovary Measurements: 3.3 x 1.7 x 2.4 cm = volume: 7 mL. Normal appearance/no adnexal mass. Other findings No abnormal free fluid. IMPRESSION: There is large fibroid in the lower uterine segment measuring up to 6.6 cm in diameter. Pelvic sonogram is otherwise unremarkable. Electronically Signed   By: Elmer Picker M.D.   On: 12/22/2021 15:25  Impression + Management Plan: Diagnoses this Encounter::   ICD-10-CM   1. DUB (dysfunctional uterine bleeding), on HRT  N93.8    will stop megestrol.  Begin duavee, continue prometrium 200 qhs.  Communicate with me in 2-4 weeks  regarding response    2. Fibroid, lower uterine segment  D21.9    5x5x6        Medications prescribed during  this encounter: Meds ordered this encounter  Medications   Conj Estrogens-Bazedoxifene 0.45-20 MG TABS    Sig: Take 1 tablet by mouth daily.    Dispense:  90 tablet    Refill:  3    Labs or Scans Ordered during this encounter: No orders of the defined types were placed in this encounter.     Follow up Return if symptoms worsen or fail to improve, for pt will communicate response via MyChart.

## 2022-02-15 ENCOUNTER — Other Ambulatory Visit (HOSPITAL_COMMUNITY): Payer: Self-pay

## 2022-04-06 ENCOUNTER — Other Ambulatory Visit (HOSPITAL_COMMUNITY): Payer: Self-pay

## 2022-04-06 DIAGNOSIS — N959 Unspecified menopausal and perimenopausal disorder: Secondary | ICD-10-CM | POA: Diagnosis not present

## 2022-04-06 DIAGNOSIS — I10 Essential (primary) hypertension: Secondary | ICD-10-CM | POA: Diagnosis not present

## 2022-04-06 MED ORDER — ACYCLOVIR 400 MG PO TABS
400.0000 mg | ORAL_TABLET | Freq: Every day | ORAL | 6 refills | Status: DC
  Filled 2022-04-06: qty 25, 5d supply, fill #0
  Filled 2022-05-23: qty 25, 5d supply, fill #1
  Filled 2022-06-10: qty 25, 5d supply, fill #2
  Filled 2022-07-06: qty 25, 5d supply, fill #3
  Filled 2022-07-11: qty 25, 5d supply, fill #4
  Filled 2022-08-21: qty 25, 5d supply, fill #5
  Filled 2022-10-06: qty 25, 5d supply, fill #6

## 2022-04-13 ENCOUNTER — Other Ambulatory Visit (HOSPITAL_COMMUNITY): Payer: Self-pay

## 2022-04-14 ENCOUNTER — Other Ambulatory Visit (HOSPITAL_COMMUNITY): Payer: Self-pay

## 2022-04-14 DIAGNOSIS — G4733 Obstructive sleep apnea (adult) (pediatric): Secondary | ICD-10-CM | POA: Diagnosis not present

## 2022-04-14 MED ORDER — DILTIAZEM HCL ER COATED BEADS 180 MG PO CP24
180.0000 mg | ORAL_CAPSULE | Freq: Every day | ORAL | 4 refills | Status: DC
  Filled 2022-04-14: qty 90, 90d supply, fill #0
  Filled 2022-07-11: qty 90, 90d supply, fill #1
  Filled 2022-08-21 – 2022-10-06 (×2): qty 90, 90d supply, fill #2
  Filled 2023-01-19: qty 90, 90d supply, fill #3
  Filled 2023-03-31: qty 90, 90d supply, fill #4

## 2022-04-15 ENCOUNTER — Other Ambulatory Visit (HOSPITAL_COMMUNITY): Payer: Self-pay

## 2022-04-28 ENCOUNTER — Encounter (HOSPITAL_COMMUNITY): Payer: Self-pay | Admitting: Hematology

## 2022-04-30 ENCOUNTER — Encounter (HOSPITAL_COMMUNITY): Payer: Self-pay | Admitting: Hematology

## 2022-05-23 ENCOUNTER — Other Ambulatory Visit: Payer: Self-pay

## 2022-05-24 ENCOUNTER — Other Ambulatory Visit (HOSPITAL_COMMUNITY): Payer: Self-pay

## 2022-05-24 ENCOUNTER — Other Ambulatory Visit: Payer: Self-pay

## 2022-05-25 ENCOUNTER — Other Ambulatory Visit: Payer: Self-pay

## 2022-05-25 ENCOUNTER — Other Ambulatory Visit (HOSPITAL_BASED_OUTPATIENT_CLINIC_OR_DEPARTMENT_OTHER): Payer: Self-pay

## 2022-06-10 ENCOUNTER — Other Ambulatory Visit (HOSPITAL_COMMUNITY): Payer: Self-pay

## 2022-07-06 ENCOUNTER — Other Ambulatory Visit: Payer: Self-pay

## 2022-07-06 ENCOUNTER — Other Ambulatory Visit (HOSPITAL_COMMUNITY): Payer: Self-pay

## 2022-07-12 ENCOUNTER — Other Ambulatory Visit: Payer: Self-pay

## 2022-07-15 ENCOUNTER — Other Ambulatory Visit (HOSPITAL_COMMUNITY): Payer: Self-pay

## 2022-08-21 ENCOUNTER — Other Ambulatory Visit: Payer: Self-pay | Admitting: Adult Health

## 2022-08-22 ENCOUNTER — Other Ambulatory Visit (HOSPITAL_COMMUNITY): Payer: Self-pay

## 2022-08-22 ENCOUNTER — Other Ambulatory Visit: Payer: Self-pay

## 2022-08-22 ENCOUNTER — Encounter (HOSPITAL_COMMUNITY): Payer: Self-pay | Admitting: Hematology

## 2022-08-22 MED ORDER — PROGESTERONE 200 MG PO CAPS
200.0000 mg | ORAL_CAPSULE | Freq: Every evening | ORAL | 3 refills | Status: DC
  Filled 2022-08-22: qty 90, 90d supply, fill #0
  Filled 2022-11-15: qty 90, 90d supply, fill #1
  Filled 2023-02-13: qty 90, 90d supply, fill #2
  Filled 2023-06-06: qty 90, 90d supply, fill #3

## 2022-08-22 MED ORDER — ESCITALOPRAM OXALATE 10 MG PO TABS
10.0000 mg | ORAL_TABLET | Freq: Every day | ORAL | 3 refills | Status: DC
  Filled 2022-08-22 – 2022-10-06 (×2): qty 90, 90d supply, fill #0
  Filled 2023-01-04: qty 90, 90d supply, fill #1
  Filled 2023-03-31 – 2023-04-11 (×2): qty 90, 90d supply, fill #2
  Filled 2023-07-12: qty 90, 90d supply, fill #3

## 2022-08-24 ENCOUNTER — Encounter (HOSPITAL_COMMUNITY): Payer: Self-pay | Admitting: Hematology

## 2022-08-24 ENCOUNTER — Other Ambulatory Visit (HOSPITAL_COMMUNITY): Payer: Self-pay

## 2022-08-25 ENCOUNTER — Other Ambulatory Visit: Payer: Self-pay

## 2022-08-25 ENCOUNTER — Other Ambulatory Visit (HOSPITAL_COMMUNITY): Payer: Self-pay

## 2022-09-07 ENCOUNTER — Other Ambulatory Visit (HOSPITAL_COMMUNITY): Payer: Self-pay

## 2022-09-07 ENCOUNTER — Encounter (HOSPITAL_COMMUNITY): Payer: Self-pay | Admitting: Hematology

## 2022-10-05 ENCOUNTER — Encounter (HOSPITAL_COMMUNITY): Payer: Self-pay | Admitting: Hematology

## 2022-10-05 ENCOUNTER — Other Ambulatory Visit (HOSPITAL_COMMUNITY)
Admission: RE | Admit: 2022-10-05 | Discharge: 2022-10-05 | Disposition: A | Discharge status: Home / Self Care | Payer: Commercial Managed Care - PPO | Source: Ambulatory Visit | Attending: Internal Medicine | Admitting: Internal Medicine

## 2022-10-05 DIAGNOSIS — Z79899 Other long term (current) drug therapy: Secondary | ICD-10-CM | POA: Insufficient documentation

## 2022-10-05 DIAGNOSIS — D509 Iron deficiency anemia, unspecified: Secondary | ICD-10-CM | POA: Insufficient documentation

## 2022-10-05 DIAGNOSIS — I10 Essential (primary) hypertension: Secondary | ICD-10-CM | POA: Insufficient documentation

## 2022-10-05 DIAGNOSIS — G43909 Migraine, unspecified, not intractable, without status migrainosus: Secondary | ICD-10-CM | POA: Diagnosis not present

## 2022-10-05 DIAGNOSIS — R7303 Prediabetes: Secondary | ICD-10-CM | POA: Insufficient documentation

## 2022-10-05 DIAGNOSIS — E785 Hyperlipidemia, unspecified: Secondary | ICD-10-CM | POA: Diagnosis not present

## 2022-10-05 LAB — CBC WITH DIFFERENTIAL/PLATELET
Abs Immature Granulocytes: 0.03 10*3/uL (ref 0.00–0.07)
Basophils Absolute: 0.1 10*3/uL (ref 0.0–0.1)
Basophils Relative: 1 %
Eosinophils Absolute: 0.3 10*3/uL (ref 0.0–0.5)
Eosinophils Relative: 4 %
HCT: 38.5 % (ref 36.0–46.0)
Hemoglobin: 12.5 g/dL (ref 12.0–15.0)
Immature Granulocytes: 0 %
Lymphocytes Relative: 26 %
Lymphs Abs: 1.8 10*3/uL (ref 0.7–4.0)
MCH: 28.4 pg (ref 26.0–34.0)
MCHC: 32.5 g/dL (ref 30.0–36.0)
MCV: 87.5 fL (ref 80.0–100.0)
Monocytes Absolute: 0.5 10*3/uL (ref 0.1–1.0)
Monocytes Relative: 7 %
Neutro Abs: 4.3 10*3/uL (ref 1.7–7.7)
Neutrophils Relative %: 62 %
Platelets: 268 10*3/uL (ref 150–400)
RBC: 4.4 MIL/uL (ref 3.87–5.11)
RDW: 13.5 % (ref 11.5–15.5)
WBC: 6.9 10*3/uL (ref 4.0–10.5)
nRBC: 0 % (ref 0.0–0.2)

## 2022-10-05 LAB — URINALYSIS, COMPLETE (UACMP) WITH MICROSCOPIC
Bacteria, UA: NONE SEEN
Bilirubin Urine: NEGATIVE
Glucose, UA: NEGATIVE mg/dL
Ketones, ur: NEGATIVE mg/dL
Leukocytes,Ua: NEGATIVE
Nitrite: NEGATIVE
Protein, ur: NEGATIVE mg/dL
Specific Gravity, Urine: 1.016 (ref 1.005–1.030)
pH: 5 (ref 5.0–8.0)

## 2022-10-05 LAB — COMPREHENSIVE METABOLIC PANEL
ALT: 17 U/L (ref 0–44)
AST: 16 U/L (ref 15–41)
Albumin: 3.8 g/dL (ref 3.5–5.0)
Alkaline Phosphatase: 66 U/L (ref 38–126)
Anion gap: 9 (ref 5–15)
BUN: 14 mg/dL (ref 6–20)
CO2: 28 mmol/L (ref 22–32)
Calcium: 8.8 mg/dL — ABNORMAL LOW (ref 8.9–10.3)
Chloride: 101 mmol/L (ref 98–111)
Creatinine, Ser: 0.85 mg/dL (ref 0.44–1.00)
GFR, Estimated: >60 mL/min (ref >60)
Glucose, Bld: 118 mg/dL — ABNORMAL HIGH (ref 70–99)
Potassium: 4.2 mmol/L (ref 3.5–5.1)
Sodium: 138 mmol/L (ref 135–145)
Total Bilirubin: 0.5 mg/dL (ref 0.3–1.2)
Total Protein: 6.9 g/dL (ref 6.5–8.1)

## 2022-10-05 LAB — LIPID PANEL
Cholesterol: 204 mg/dL — ABNORMAL HIGH (ref 0–200)
HDL: 36 mg/dL — ABNORMAL LOW (ref >40)
LDL Cholesterol: 123 mg/dL — ABNORMAL HIGH (ref 0–99)
Total CHOL/HDL Ratio: 5.7 RATIO
Triglycerides: 225 mg/dL — ABNORMAL HIGH (ref <150)
VLDL: 45 mg/dL — ABNORMAL HIGH (ref 0–40)

## 2022-10-05 LAB — HEMOGLOBIN A1C
Hgb A1c MFr Bld: 6.1 % — ABNORMAL HIGH (ref 4.8–5.6)
Mean Plasma Glucose: 128.37 mg/dL

## 2022-10-06 ENCOUNTER — Other Ambulatory Visit: Payer: Self-pay

## 2022-10-06 ENCOUNTER — Other Ambulatory Visit (HOSPITAL_COMMUNITY): Payer: Self-pay

## 2022-10-11 ENCOUNTER — Other Ambulatory Visit: Payer: Self-pay

## 2022-10-11 DIAGNOSIS — I10 Essential (primary) hypertension: Secondary | ICD-10-CM | POA: Diagnosis not present

## 2022-10-11 DIAGNOSIS — Z0000 Encounter for general adult medical examination without abnormal findings: Secondary | ICD-10-CM | POA: Diagnosis not present

## 2022-10-11 DIAGNOSIS — G4739 Other sleep apnea: Secondary | ICD-10-CM | POA: Diagnosis not present

## 2022-10-11 DIAGNOSIS — N950 Postmenopausal bleeding: Secondary | ICD-10-CM | POA: Diagnosis not present

## 2022-10-11 DIAGNOSIS — R7309 Other abnormal glucose: Secondary | ICD-10-CM | POA: Diagnosis not present

## 2022-10-11 DIAGNOSIS — Z0001 Encounter for general adult medical examination with abnormal findings: Secondary | ICD-10-CM | POA: Diagnosis not present

## 2022-10-11 IMAGING — MG MM DIGITAL SCREENING BILAT W/ TOMO AND CAD
6 of 10 series · 6 of 30 positions shown · non-contrast
Comparison: Previous exam(s).

CLINICAL DATA: Screening.

EXAM:
DIGITAL SCREENING BILATERAL MAMMOGRAM WITH TOMOSYNTHESIS AND CAD
TECHNIQUE: Bilateral screening digital craniocaudal and mediolateral oblique
mammograms were obtained. Bilateral screening digital breast
tomosynthesis was performed. The images were evaluated with
computer-aided detection.

[L MLO synth-2D]
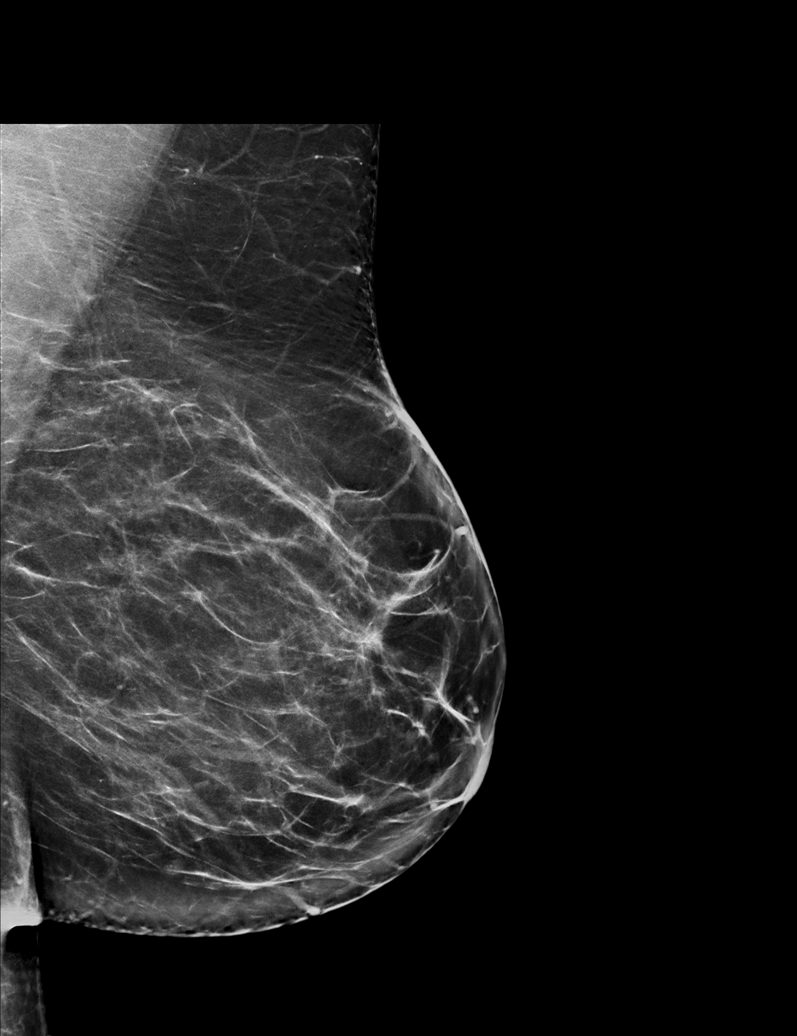

[R CC synth-2D]
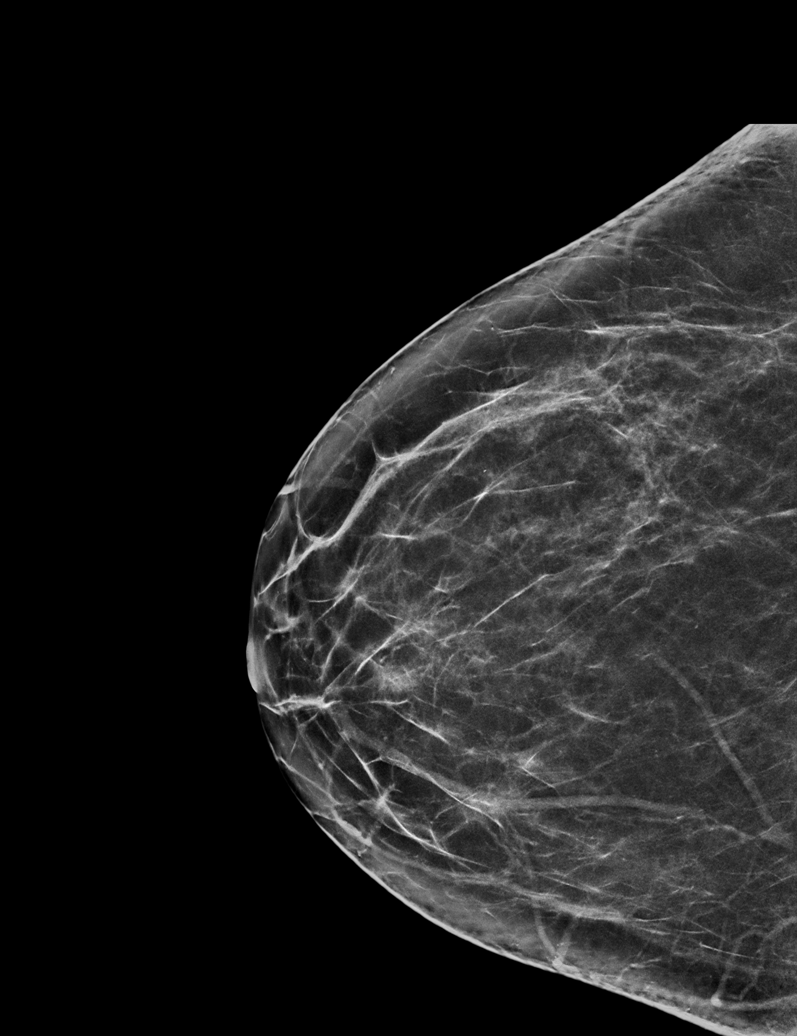

[L CC synth-2D (1 of 2)]
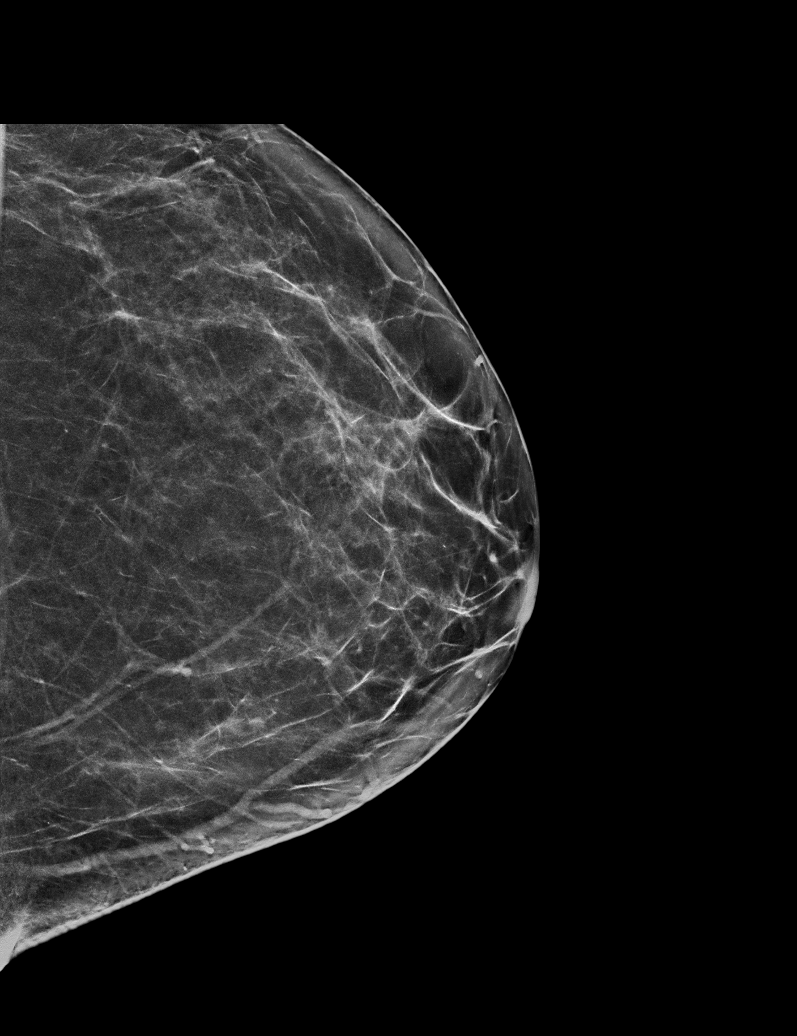

[L CC synth-2D (2 of 2)]
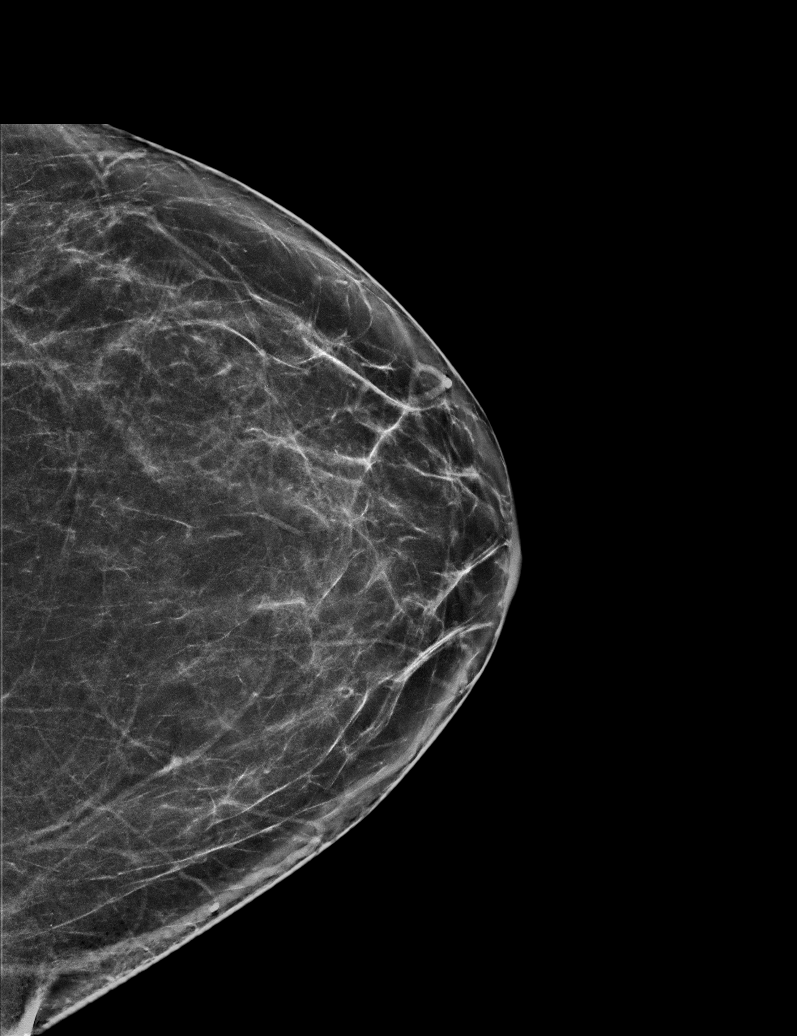

[R MLO synth-2D]
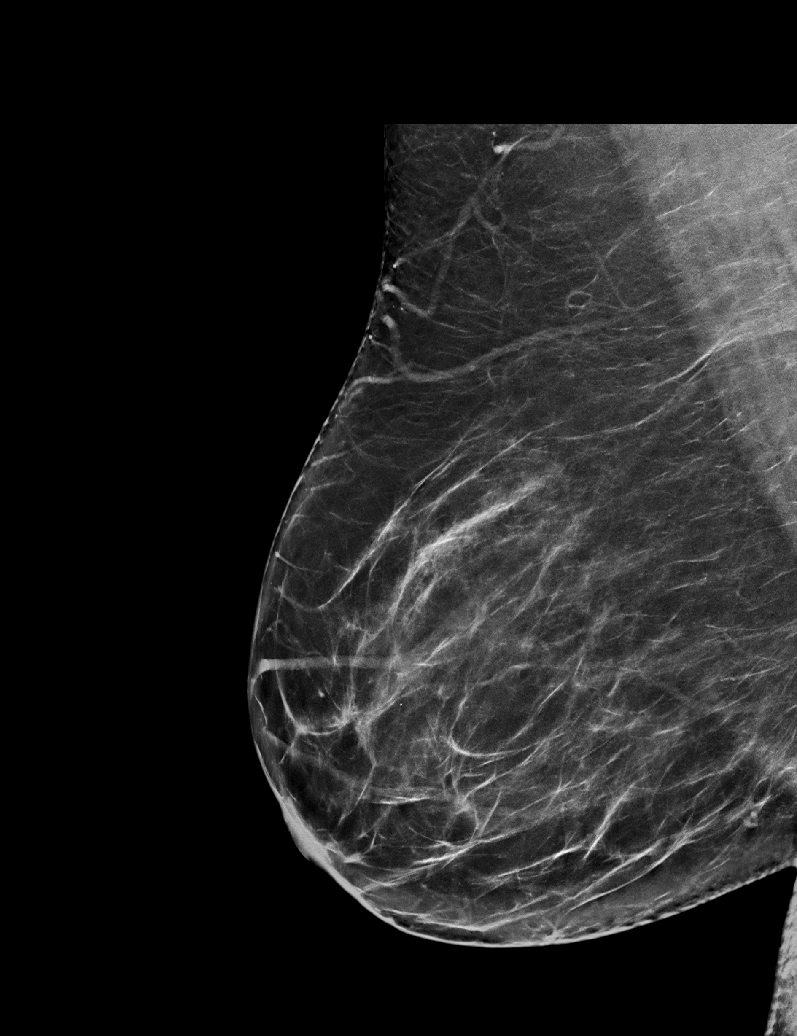

[L CC tomo · tomo slice 33/66.0]
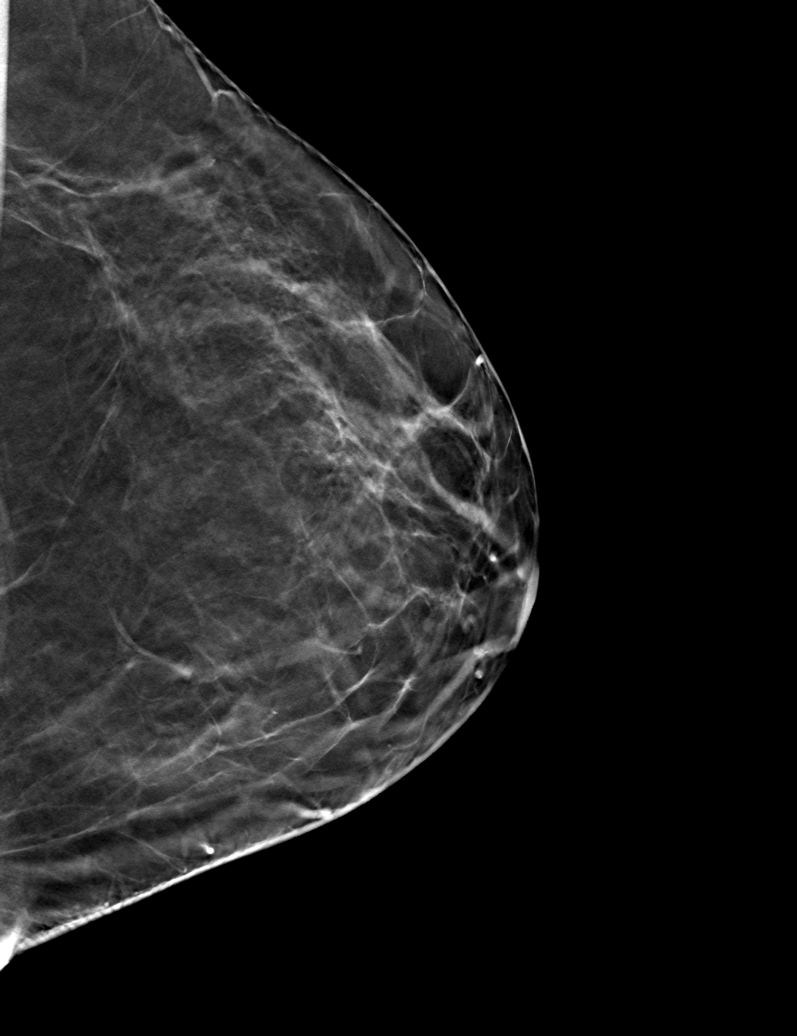

[6 of 30 positions shown; findings below may reference images not displayed]

ACR Breast Density Category b: There are scattered areas of
fibroglandular density.
FINDINGS: There are no findings suspicious for malignancy.
IMPRESSION: No mammographic evidence of malignancy. A result letter of this
screening mammogram will be mailed directly to the patient.

RECOMMENDATION:
Screening mammogram in one year. (Code:51-O-LD2)

BI-RADS CATEGORY  1: Negative.

## 2022-10-11 MED ORDER — RIZATRIPTAN BENZOATE 10 MG PO TABS
10.0000 mg | ORAL_TABLET | Freq: Every day | ORAL | 12 refills | Status: AC | PRN
  Filled 2022-10-11: qty 18, 30d supply, fill #0
  Filled 2023-07-12: qty 18, 30d supply, fill #1

## 2022-11-15 ENCOUNTER — Other Ambulatory Visit (HOSPITAL_COMMUNITY): Payer: Self-pay

## 2022-11-15 MED ORDER — ACYCLOVIR 400 MG PO TABS
400.0000 mg | ORAL_TABLET | Freq: Every day | ORAL | 6 refills | Status: DC
  Filled 2022-11-15: qty 25, 5d supply, fill #0
  Filled 2023-01-04: qty 25, 5d supply, fill #1
  Filled 2023-01-19: qty 25, 5d supply, fill #2
  Filled 2023-03-12: qty 25, 5d supply, fill #3
  Filled 2023-06-06: qty 25, 5d supply, fill #4
  Filled 2023-07-12: qty 25, 5d supply, fill #5
  Filled 2023-10-13 – 2023-10-29 (×2): qty 25, 5d supply, fill #6

## 2022-11-16 ENCOUNTER — Other Ambulatory Visit: Payer: Self-pay

## 2022-11-18 ENCOUNTER — Other Ambulatory Visit: Payer: Self-pay

## 2023-01-04 ENCOUNTER — Other Ambulatory Visit (HOSPITAL_COMMUNITY): Payer: Self-pay

## 2023-01-18 ENCOUNTER — Other Ambulatory Visit (HOSPITAL_COMMUNITY)
Admission: RE | Admit: 2023-01-18 | Discharge: 2023-01-18 | Disposition: A | Discharge status: Home / Self Care | Payer: Commercial Managed Care - PPO | Source: Ambulatory Visit | Attending: Internal Medicine | Admitting: Internal Medicine

## 2023-01-18 DIAGNOSIS — X58XXXA Exposure to other specified factors, initial encounter: Secondary | ICD-10-CM | POA: Insufficient documentation

## 2023-01-18 DIAGNOSIS — T07XXXA Unspecified multiple injuries, initial encounter: Secondary | ICD-10-CM | POA: Diagnosis not present

## 2023-01-19 ENCOUNTER — Other Ambulatory Visit (HOSPITAL_COMMUNITY): Payer: Self-pay

## 2023-01-19 LAB — LYME DISEASE SEROLOGY W/REFLEX: Lyme Total Antibody EIA: NEGATIVE

## 2023-01-20 ENCOUNTER — Other Ambulatory Visit (HOSPITAL_COMMUNITY)
Admission: RE | Admit: 2023-01-20 | Discharge: 2023-01-20 | Disposition: A | Discharge status: Home / Self Care | Payer: Commercial Managed Care - PPO | Source: Ambulatory Visit | Attending: Internal Medicine | Admitting: Internal Medicine

## 2023-01-20 DIAGNOSIS — D509 Iron deficiency anemia, unspecified: Secondary | ICD-10-CM | POA: Diagnosis not present

## 2023-01-20 DIAGNOSIS — M26621 Arthralgia of right temporomandibular joint: Secondary | ICD-10-CM | POA: Diagnosis not present

## 2023-01-20 LAB — COMPREHENSIVE METABOLIC PANEL
ALT: 15 U/L (ref 0–44)
AST: 16 U/L (ref 15–41)
Albumin: 3.7 g/dL (ref 3.5–5.0)
Alkaline Phosphatase: 64 U/L (ref 38–126)
Anion gap: 9 (ref 5–15)
BUN: 12 mg/dL (ref 6–20)
CO2: 26 mmol/L (ref 22–32)
Calcium: 8.7 mg/dL — ABNORMAL LOW (ref 8.9–10.3)
Chloride: 102 mmol/L (ref 98–111)
Creatinine, Ser: 0.89 mg/dL (ref 0.44–1.00)
GFR, Estimated: >60 mL/min (ref >60)
Glucose, Bld: 111 mg/dL — ABNORMAL HIGH (ref 70–99)
Potassium: 4 mmol/L (ref 3.5–5.1)
Sodium: 137 mmol/L (ref 135–145)
Total Bilirubin: 0.5 mg/dL (ref 0.3–1.2)
Total Protein: 7 g/dL (ref 6.5–8.1)

## 2023-01-20 LAB — CBC WITH DIFFERENTIAL/PLATELET
Abs Immature Granulocytes: 0.02 10*3/uL (ref 0.00–0.07)
Basophils Absolute: 0.1 10*3/uL (ref 0.0–0.1)
Basophils Relative: 1 %
Eosinophils Absolute: 0.2 10*3/uL (ref 0.0–0.5)
Eosinophils Relative: 3 %
HCT: 39.1 % (ref 36.0–46.0)
Hemoglobin: 12.5 g/dL (ref 12.0–15.0)
Immature Granulocytes: 0 %
Lymphocytes Relative: 25 %
Lymphs Abs: 1.6 10*3/uL (ref 0.7–4.0)
MCH: 27.8 pg (ref 26.0–34.0)
MCHC: 32 g/dL (ref 30.0–36.0)
MCV: 87.1 fL (ref 80.0–100.0)
Monocytes Absolute: 0.4 10*3/uL (ref 0.1–1.0)
Monocytes Relative: 6 %
Neutro Abs: 4.3 10*3/uL (ref 1.7–7.7)
Neutrophils Relative %: 65 %
Platelets: 281 10*3/uL (ref 150–400)
RBC: 4.49 MIL/uL (ref 3.87–5.11)
RDW: 13.2 % (ref 11.5–15.5)
WBC: 6.6 10*3/uL (ref 4.0–10.5)
nRBC: 0 % (ref 0.0–0.2)

## 2023-01-20 LAB — SEDIMENTATION RATE: Sed Rate: 14 mm/h (ref 0–22)

## 2023-01-20 LAB — C-REACTIVE PROTEIN: CRP: 0.6 mg/dL (ref <1.0)

## 2023-01-21 LAB — RHEUMATOID FACTOR: Rheumatoid fact SerPl-aCnc: <10 IU/mL (ref <14.0)

## 2023-01-23 LAB — CYCLIC CITRUL PEPTIDE ANTIBODY, IGG/IGA: CCP Antibodies IgG/IgA: 8 U (ref 0–19)

## 2023-02-04 LAB — LYME DISEASE, WESTERN BLOT
IgG P18 Ab.: ABSENT
IgG P23 Ab.: ABSENT
IgG P28 Ab.: ABSENT
IgG P30 Ab.: ABSENT
IgG P39 Ab.: ABSENT
IgG P41 Ab.: ABSENT
IgG P45 Ab.: ABSENT
IgG P58 Ab.: ABSENT
IgG P66 Ab.: ABSENT
IgG P93 Ab.: ABSENT
IgM P23 Ab.: ABSENT
IgM P39 Ab.: ABSENT
IgM P41 Ab.: ABSENT
Lyme IgG Wb: NEGATIVE
Lyme IgM Wb: NEGATIVE

## 2023-02-13 ENCOUNTER — Other Ambulatory Visit (HOSPITAL_COMMUNITY): Payer: Self-pay

## 2023-02-14 ENCOUNTER — Other Ambulatory Visit (INDEPENDENT_AMBULATORY_CARE_PROVIDER_SITE_OTHER): Payer: Commercial Managed Care - PPO

## 2023-02-14 ENCOUNTER — Encounter: Payer: Self-pay | Admitting: Orthopedic Surgery

## 2023-02-14 ENCOUNTER — Ambulatory Visit: Payer: Commercial Managed Care - PPO | Admitting: Orthopedic Surgery

## 2023-02-14 ENCOUNTER — Other Ambulatory Visit: Payer: Self-pay

## 2023-02-14 VITALS — BP 117/78 | HR 65 | Ht 65.0 in | Wt 221.0 lb

## 2023-02-14 DIAGNOSIS — M25362 Other instability, left knee: Secondary | ICD-10-CM

## 2023-02-14 DIAGNOSIS — M25561 Pain in right knee: Secondary | ICD-10-CM | POA: Diagnosis not present

## 2023-02-14 DIAGNOSIS — G8929 Other chronic pain: Secondary | ICD-10-CM

## 2023-02-14 DIAGNOSIS — M25562 Pain in left knee: Secondary | ICD-10-CM

## 2023-02-14 NOTE — Addendum Note (Signed)
Addended by: Baird Kay on: 02/14/2023 09:17 AM   Modules accepted: Orders

## 2023-02-14 NOTE — Patient Instructions (Signed)

## 2023-02-14 NOTE — Progress Notes (Signed)
New Patient Visit  Assessment: Gina Whitaker is a 57 y.o. female with the following: 1.  Chronic bilateral knee pain 2. Knee instability, left  Plan: Sylvan Ramakrishnan has pain in both knees, however left is worse than right.  She describes several episodes of twisting injuries, which results in sharp pain in the left knee.  This is limiting her function.  She notes popping sensations in the left knee.  She does have occasional catching.  She has had some swelling in both knees.  She is tender to palpation along the medial joint line.  She has pain with hyperflexion.  She has tried bracing, medications, activity modifications, with limited improvement in her overall symptoms.  She works as a Engineer, civil (consulting).  She is on her feet at all times.  Based on her presentation, I am concerned about a medial meniscus injury.  As such, I am recommending an MRI of the left knee.  She is also interested in an injection, which could help with her current symptoms.  Once the MRI is complete, she will return to clinic to discuss the findings.  Procedure note injection Left knee joint   Verbal consent was obtained to inject the left knee joint  Timeout was completed to confirm the site of injection.  The skin was prepped with alcohol and ethyl chloride was sprayed at the injection site.  A 21-gauge needle was used to inject 40 mg of Depo-Medrol and 1% lidocaine (4 cc) into the left knee using an anterolateral approach.  There were no complications. A sterile bandage was applied.     Follow-up: Return for After MRI.  Subjective:  Chief Complaint  Patient presents with   Knee Pain    Bilat knees L > R. Pt states she turned while working and felt a pop with some giving way. Pt states she did have nerves burned in '15 in both knees for prepetellar     History of Present Illness: Gina Whitaker is a 57 y.o. female who has been referred by Carylon Perches, MD for evaluation of bilateral knee pain.  She has been having  pain in both knees.  Left is worse than right.  Approximately 3 months ago, she states at work, she twisted her knee and some recall her name.  She had sharp pains in the medial aspect of the left knee.  She had a similar type episode later that same day.  A couple weeks later, she once again had the catching and buckling sensation in the left knee.  Both knees have been swollen.  No injury to her right knee.  She has tried a knee sleeve, as well as a larger brace.  She has tried Voltaren gel, diclofenac, as well as Aleve.  Pain is primarily medial.  She has not had an injection.  She has not worked with physical therapy.  She does have a history of nerve ablation in bilateral knees, for anterior knee pain.  She notes some instability in the left knee.  She is unable to complete all duties at work as a result of her left knee pain.   Review of Systems: No fevers or chills No numbness or tingling No chest pain No shortness of breath No bowel or bladder dysfunction No GI distress No headaches   Medical History:  Past Medical History:  Diagnosis Date   Asthma    Diverticulitis    Fibroid 12/24/2021   Hypertension    Sleep apnea     Past Surgical History:  Procedure Laterality Date   APPENDECTOMY     CHOLECYSTECTOMY     COLONOSCOPY WITH PROPOFOL N/A 05/13/2020   Procedure: COLONOSCOPY WITH PROPOFOL;  Surgeon: Malissa Hippo, MD;  Location: [facility];  Service: Endoscopy;  Laterality: N/A;  730   endometrial ablation     NECK SURGERY  2017   t lift and fixation  C4 C5 C6 C7    TEMPOROMANDIBULAR JOINT SURGERY     twice bilateral    Family History  Problem Relation Age of Onset   Congestive Heart Failure Paternal Grandfather    Heart attack Paternal Grandfather    Dementia Paternal Grandfather    Basal cell carcinoma Paternal Grandfather    Cancer Paternal Grandmother        pancreatic   Aneurysm Maternal Grandmother    Hypertension Maternal Grandmother    CAD Maternal  Grandmother    Heart attack Maternal Grandfather    Mental illness Maternal Grandfather    Hypertension Father    Basal cell carcinoma Father    Cancer Father        prostate   Glaucoma Father    Hypertension Mother    Melanoma Mother    High Cholesterol Mother    Irritable bowel syndrome Mother    Iron deficiency Mother    Arthritis Mother    High Cholesterol Brother    Hypertension Brother    Other Brother    Asthma Daughter    Polycystic ovary syndrome Daughter    Social History   Tobacco Use   Smoking status: Former    Current packs/day: 0.00    Average packs/day: 3.0 packs/day for 6.0 years (18.0 ttl pk-yrs)    Types: Cigarettes    Start date: 9    Quit date: 1994    Years since quitting: 30.7   Smokeless tobacco: Never  Vaping Use   Vaping status: Never Used  Substance Use Topics   Alcohol use: Never   Drug use: Never    Allergies  Allergen Reactions   Hydrocodone Rash and Swelling   Latex Swelling and Rash   Tramadol     Rash and facial swelling   Erythromycin Nausea And Vomiting   Watermelon [Citrullus Vulgaris]     Oral itch syndrome    Codeine Itching and Rash   Morphine Itching, Swelling and Rash   Penicillins Rash    Current Meds  Medication Sig   acyclovir (ZOVIRAX) 400 MG tablet Take 1 tablet (400 mg total) by mouth 5 (five) times daily for 5 days.   albuterol (VENTOLIN HFA) 108 (90 Base) MCG/ACT inhaler Inhale into the lungs every 4 (four) hours as needed for wheezing or shortness of breath.   cetirizine (ZYRTEC) 10 MG tablet Take 10 mg by mouth daily.   Conj Estrogens-Bazedoxifene 0.45-20 MG TABS Take 1 tablet by mouth daily.   diltiazem (CARDIZEM CD) 180 MG 24 hr capsule Take 1 capsule (180 mg total) by mouth daily.   docusate sodium (COLACE) 100 MG capsule Take 100 mg by mouth daily.   escitalopram (LEXAPRO) 10 MG tablet Take 1 tablet (10 mg total) by mouth daily.   fluticasone (FLONASE) 50 MCG/ACT nasal spray Place 2 sprays into both  nostrils daily.   Multiple Vitamins-Minerals (ADULT GUMMY PO) Take 2 capsules by mouth daily.   progesterone (PROMETRIUM) 200 MG capsule Take 1 capsule (200 mg total) by mouth Nightly.   rizatriptan (MAXALT) 10 MG tablet Take 1 tablet (10 mg total) by mouth daily as  needed.    Objective: BP 117/78   Pulse 65   Ht 5\' 5"  (1.651 m)   Wt 221 lb (100.2 kg)   LMP 09/21/2019   BMI 36.78 kg/m   Physical Exam:  General: Alert and oriented. and No acute distress. Gait: Left sided antalgic gait.  Evaluation left knee demonstrates a mild effusion.  Tenderness palpation along the medial joint line.  Some mild tenderness to palpation over the medial tibial plateau.  She has full range of motion, but does have some difficulty due to pain achieving full extension.  Pain with hyperflexion.  Pain with McMurray's testing.  Negative Lachman.  No increased laxity varus valgus stress.  Evaluation of the right knee demonstrates no swelling.  No tenderness to palpation.  She is good range of motion.  Negative Lachman.  No increased laxity varus or valgus stress.  IMAGING: I personally ordered and reviewed the following images   Trays of bilateral knees were obtained in clinic today.  No acute injuries noted.  Neutral overall alignment.  Well-maintained joint space in the medial lateral compartments.  Within the patellofemoral compartment, joint spaces maintained, with very small osteophytes on both the medial and lateral trochlea.  Impression: Bilateral knee x-rays with mild degenerative changes   New Medications:  No orders of the defined types were placed in this encounter.     Oliver Barre, MD  02/14/2023 9:07 AM

## 2023-02-17 ENCOUNTER — Ambulatory Visit (HOSPITAL_COMMUNITY)
Admission: RE | Admit: 2023-02-17 | Discharge: 2023-02-17 | Disposition: A | Discharge status: Home / Self Care | Payer: Commercial Managed Care - PPO | Source: Ambulatory Visit | Attending: Orthopedic Surgery | Admitting: Orthopedic Surgery

## 2023-02-17 DIAGNOSIS — G8929 Other chronic pain: Secondary | ICD-10-CM | POA: Diagnosis not present

## 2023-02-17 DIAGNOSIS — M7642 Tibial collateral bursitis [Pellegrini-Stieda], left leg: Secondary | ICD-10-CM | POA: Diagnosis not present

## 2023-02-17 DIAGNOSIS — M25362 Other instability, left knee: Secondary | ICD-10-CM | POA: Diagnosis not present

## 2023-02-17 DIAGNOSIS — M948X6 Other specified disorders of cartilage, lower leg: Secondary | ICD-10-CM | POA: Diagnosis not present

## 2023-02-17 DIAGNOSIS — M1712 Unilateral primary osteoarthritis, left knee: Secondary | ICD-10-CM | POA: Diagnosis not present

## 2023-02-17 DIAGNOSIS — M25562 Pain in left knee: Secondary | ICD-10-CM | POA: Diagnosis not present

## 2023-02-28 ENCOUNTER — Ambulatory Visit: Payer: Commercial Managed Care - PPO | Admitting: Orthopedic Surgery

## 2023-03-01 ENCOUNTER — Ambulatory Visit: Payer: Commercial Managed Care - PPO | Admitting: Orthopedic Surgery

## 2023-03-01 ENCOUNTER — Encounter: Payer: Self-pay | Admitting: Orthopedic Surgery

## 2023-03-01 VITALS — BP 145/80 | HR 73

## 2023-03-01 DIAGNOSIS — G8929 Other chronic pain: Secondary | ICD-10-CM | POA: Diagnosis not present

## 2023-03-01 DIAGNOSIS — M25562 Pain in left knee: Secondary | ICD-10-CM | POA: Diagnosis not present

## 2023-03-01 NOTE — Patient Instructions (Signed)

## 2023-03-01 NOTE — Progress Notes (Signed)
New Patient Visit  Assessment: Gina Whitaker is a 57 y.o. female with the following: 1.  Chronic bilateral knee pain 2. Knee instability, left  Plan: Gina Whitaker continues to have pain in the left knee.  She notes some improvement following the most recent injection.  We reviewed the MRI in clinic today, which demonstrates some cartilage damage within the patellofemoral joint, as well as within the medial compartment.  In addition, she does have a tear of the medial meniscus, but this does not extend to the surface of the meniscus.  As such, I do not think that there is a role for surgery at this time.  I have recommended some focused activities, and provided her with a home exercise program.  Continue medications as needed.  Continued use of brace.  I would like to see her in 6 weeks for repeat evaluation.  If she has pain or worsening symptoms before then, I have asked her to return to clinic.  Depending on how much improvement she sees, we may have to consider a diagnostic arthroscopy, or further evaluation.      Follow-up: Return in about 6 weeks (around 04/12/2023).  Subjective:  Chief Complaint  Patient presents with   Results    MRI - Left Knee    History of Present Illness: Gina Whitaker is a 57 y.o. female who returns to clinic for repeat evaluation of left knee pain.  She has had pain specifically within the medial aspect of the left knee for a few months.  She has obtained an MRI, and is here discuss the findings.  At the last visit, we injected the left knee, and she states that it took about a week for her pain to improve.  She does continue to have pain in the medial knee.  Pain throughout the rest of the knee has improved.  Review of Systems: No fevers or chills No numbness or tingling No chest pain No shortness of breath No bowel or bladder dysfunction No GI distress No headaches   Objective: BP (!) 145/80   Pulse 73   LMP 09/21/2019   Physical  Exam:  General: Alert and oriented. and No acute distress. Gait: Left sided antalgic gait.  Evaluation left knee demonstrates no effusion.  Tenderness to palpation along the medial joint line.  Some mild tenderness to palpation over the medial tibial plateau.  Pain with hyperflexion.  Pain with McMurray's testing.  Negative Lachman.  No increased laxity varus valgus stress.   IMAGING: I personally reviewed images previously obtained in clinic   Left knee MRI  IMPRESSION: 1. Moderate to high-grade thinning of the weight-bearing medial femoral condyle cartilage. 2. Full-thickness fissure within the superior aspect of the patellar apex cartilage with high-grade surrounding cartilage thinning and moderate focal subchondral cystic change. Partial-thickness thin fissure within the lateral aspect of the lateral patellar facet at mid height. Moderate inferior trochlear cartilage degenerative changes. Full-thickness cartilage loss within the adjacent inferior lateral aspect of the medial trochlear cartilage. 3. Mild proximal deep patellar tendinosis. 4. Mild semimembranosus-tibial collateral ligament bursitis. 5. Horizontal linear increased proton density signal extending from the posterior wall of the posterior horn of the medial meniscus into the midsubstance of the middle third of the meniscal triangle, but not extending through an articular surface of the medial meniscus.  New Medications:  No orders of the defined types were placed in this encounter.     Oliver Barre, MD  03/01/2023 9:12 AM

## 2023-03-12 ENCOUNTER — Other Ambulatory Visit: Payer: Self-pay | Admitting: Obstetrics & Gynecology

## 2023-03-13 ENCOUNTER — Other Ambulatory Visit: Payer: Self-pay

## 2023-03-15 DIAGNOSIS — H93293 Other abnormal auditory perceptions, bilateral: Secondary | ICD-10-CM | POA: Diagnosis not present

## 2023-03-15 DIAGNOSIS — H9313 Tinnitus, bilateral: Secondary | ICD-10-CM | POA: Diagnosis not present

## 2023-03-21 ENCOUNTER — Telehealth: Payer: Self-pay | Admitting: Obstetrics & Gynecology

## 2023-03-21 ENCOUNTER — Other Ambulatory Visit (HOSPITAL_COMMUNITY): Payer: Self-pay

## 2023-03-21 MED ORDER — DUAVEE 0.45-20 MG PO TABS
1.0000 | ORAL_TABLET | Freq: Every day | ORAL | 3 refills | Status: DC
  Filled 2023-03-21: qty 90, 90d supply, fill #0
  Filled 2023-06-06: qty 90, 90d supply, fill #1

## 2023-03-21 NOTE — Telephone Encounter (Signed)
Refill request sent to Dr. Eure.  

## 2023-03-21 NOTE — Telephone Encounter (Signed)
Patient called about the rx [facility] Her pharmacy told her they was waiting on approval for the doctor's office. Please advise.

## 2023-03-22 ENCOUNTER — Other Ambulatory Visit (HOSPITAL_COMMUNITY): Payer: Self-pay

## 2023-03-22 ENCOUNTER — Other Ambulatory Visit: Payer: Self-pay

## 2023-04-01 ENCOUNTER — Other Ambulatory Visit (HOSPITAL_COMMUNITY): Payer: Self-pay

## 2023-04-11 ENCOUNTER — Encounter: Payer: Self-pay | Admitting: Orthopedic Surgery

## 2023-04-11 ENCOUNTER — Other Ambulatory Visit (HOSPITAL_COMMUNITY): Payer: Self-pay

## 2023-04-11 ENCOUNTER — Ambulatory Visit (INDEPENDENT_AMBULATORY_CARE_PROVIDER_SITE_OTHER): Payer: Commercial Managed Care - PPO | Admitting: Orthopedic Surgery

## 2023-04-11 VITALS — BP 142/83 | HR 79 | Ht 65.0 in | Wt 225.0 lb

## 2023-04-11 DIAGNOSIS — M25362 Other instability, left knee: Secondary | ICD-10-CM | POA: Diagnosis not present

## 2023-04-11 NOTE — Progress Notes (Signed)
Return patient Visit  Assessment: Gina Whitaker is a 57 y.o. female with the following: Medial left knee pain  Plan: Danene Tineo states that she is doing much better.  She is doing exercises at home.  Medications as needed.  Prior injection was helpful.  She did have some recent worsening of pain in the medial aspect of the left knee.  However, she feels much better over.  We discussed the findings and the MRI, which does include some mild to moderate degenerative changes, as well as a meniscus tear along medial compartment, which does not reach the surface.  There is no displacement.  Depending on her symptoms, we can consider arthroscopy.  This was discussed again in clinic.  At this point, she will continue with her medicines, activities as tolerated, with further consideration for injections.      Follow-up: Return if symptoms worsen or fail to improve.  Subjective:  Chief Complaint  Patient presents with   Knee Pain    L knee was feeling much better but pain came back Thursday with a sharp stabbing pain but now the pain is back. Not as bad as it was previously but in the same spot.     History of Present Illness: [name] is a 57 y.o. female who returns to clinic for repeat evaluation of left knee pain.  Pain in the medial aspect of the left knee has improved.  She has been doing exercises at home.  Prior injection was helpful.  She states that she had acute worsening of pain in the medial aspect of the knee, approximately 1 week ago.  She has taken to the ED since then.  It has resulted in some improvement since last week.  No recent falls, twists or stumbles.   Review of Systems: No fevers or chills No numbness or tingling No chest pain No shortness of breath No bowel or bladder dysfunction No GI distress No headaches   Objective: BP (!) 142/83   Pulse 79   Ht 5\' 5"  (1.651 m)   Wt 225 lb (102.1 kg)   LMP 09/21/2019   BMI 37.44 kg/m   Physical  Exam:  General: Alert and oriented. and No acute distress. Gait: Normal gait.  Left knee without effusion.  Mild tenderness to palpation on the medial joint line.  No worsening of pain with hyperflexion.  Range of motion from 0-120 degrees.  No increased laxity to varus or valgus stress.  Negative Lachman.   IMAGING: No new imaging obtained today     New Medications:  No orders of the defined types were placed in this encounter.     Oliver Barre, MD  04/11/2023 9:34 AM

## 2023-04-16 ENCOUNTER — Other Ambulatory Visit (HOSPITAL_COMMUNITY): Payer: Self-pay

## 2023-04-17 ENCOUNTER — Encounter (HOSPITAL_COMMUNITY): Payer: Self-pay | Admitting: Hematology

## 2023-04-17 ENCOUNTER — Encounter (HOSPITAL_COMMUNITY): Payer: Self-pay

## 2023-04-17 ENCOUNTER — Other Ambulatory Visit (HOSPITAL_COMMUNITY): Payer: Self-pay

## 2023-04-17 MED ORDER — DILTIAZEM HCL ER COATED BEADS 180 MG PO CP24
180.0000 mg | ORAL_CAPSULE | Freq: Every day | ORAL | 4 refills | Status: AC
  Filled 2023-04-17: qty 90, 90d supply, fill #0
  Filled 2023-07-12: qty 90, 90d supply, fill #1
  Filled 2023-10-13 – 2023-10-29 (×2): qty 90, 90d supply, fill #2
  Filled 2024-01-20: qty 90, 90d supply, fill #3
  Filled 2024-04-10: qty 90, 90d supply, fill #4

## 2023-04-18 ENCOUNTER — Other Ambulatory Visit: Payer: Self-pay

## 2023-04-18 ENCOUNTER — Other Ambulatory Visit (HOSPITAL_COMMUNITY): Payer: Self-pay

## 2023-06-07 ENCOUNTER — Encounter (HOSPITAL_COMMUNITY): Payer: Self-pay | Admitting: Hematology

## 2023-06-07 ENCOUNTER — Other Ambulatory Visit: Payer: Self-pay

## 2023-07-12 ENCOUNTER — Encounter: Payer: Self-pay | Admitting: Orthopedic Surgery

## 2023-07-12 ENCOUNTER — Other Ambulatory Visit (HOSPITAL_COMMUNITY): Payer: Self-pay

## 2023-07-12 ENCOUNTER — Ambulatory Visit: Payer: Commercial Managed Care - PPO | Admitting: Orthopedic Surgery

## 2023-07-12 ENCOUNTER — Other Ambulatory Visit (INDEPENDENT_AMBULATORY_CARE_PROVIDER_SITE_OTHER): Payer: Self-pay

## 2023-07-12 VITALS — BP 135/82 | HR 82 | Ht 65.0 in | Wt 224.0 lb

## 2023-07-12 DIAGNOSIS — G8929 Other chronic pain: Secondary | ICD-10-CM

## 2023-07-12 DIAGNOSIS — M25562 Pain in left knee: Secondary | ICD-10-CM | POA: Diagnosis not present

## 2023-07-12 DIAGNOSIS — M25362 Other instability, left knee: Secondary | ICD-10-CM

## 2023-07-12 NOTE — Patient Instructions (Signed)

## 2023-07-12 NOTE — Progress Notes (Addendum)
 Return patient Visit  Assessment: Gina Whitaker is a 58 y.o. female with the following: Medial left knee pain  Plan: Gina Whitaker has had a couple of falls, directly onto the left knee since her last injection.  Pain has progressively worsened.  Repeat radiographs today are without acute injury.  Mild loss of joint space medially.  I reviewed the MRI once again, and she does have some degenerative changes within the medial and patellofemoral compartments.  She may have an effusion, but not sufficient enough to discuss aspiration.  She is interested in another injection.  This was completed in clinic today.  Continue with exercises as tolerated.  We also briefly discussed the possibility of proceeding with hyaluronic acid injections, and even PRP injections.  Think she could benefit from either of these injections.  Will work to get her more information regarding coverage by insurance, as well as out-of-pocket costs.   Procedure note injection Left knee joint   Verbal consent was obtained to inject the left knee joint  Timeout was completed to confirm the site of injection.  The skin was prepped with alcohol and ethyl chloride was sprayed at the injection site.  A 21-gauge needle was used to inject 40 mg of Depo-Medrol and 1% lidocaine (4 cc) into the left knee using an anterolateral approach.  There were no complications. A sterile bandage was applied.        Follow-up: Return if symptoms worsen or fail to improve.  Subjective:  Chief Complaint  Patient presents with   Knee Pain    Pt states she had 2 falls within a 3 day timeframe right after injections. Pt states since the falls pain hasn't gotten any better and increased swelling. States she's had catching, locking, and painful popping.     History of Present Illness: Gina Whitaker is a 58 y.o. female who returns to clinic for repeat evaluation of left knee pain.  She states that the prior injection was very helpful,  however she sustained a couple of falls which has worsened her pain.  She continues to have pain in the medial and anterior aspect of the knee.  Pain gets worse at night.  She notes pain radiating distally.  She is struggling with range of motion overall, but does not note a large effusion.  She has had some catching and locking sensations.  She continues to take NSAIDs, and is using topical treatments.  She would like to avoid surgery if possible.  Review of Systems: No fevers or chills No numbness or tingling No chest pain No shortness of breath No bowel or bladder dysfunction No GI distress No headaches   Objective: BP 135/82   Pulse 82   Ht 5\' 5"  (1.651 m)   Wt 224 lb (101.6 kg)   LMP 09/21/2019   BMI 37.28 kg/m   Physical Exam:  General: Alert and oriented. and No acute distress. Gait: Left sided antalgic gait.  Left knee without effusion.  Mild tenderness palpation along the medial joint line, as well as the tibial tuberosity.  She is able to achieve full extension.  She has pain with flexion beyond 100 degrees.  Negative Lachman.  No increased laxity varus valgus stress.   IMAGING: I personally ordered and reviewed the following images   X-rays left knee were obtained in clinic today.  No acute injuries noted.  Neutral overall alignment.  Mild loss of joint space within the medial compartment.  Minimal osteophytes.  Mild loss of joint space within  the patellofemoral compartment.  No bony lesions.  Impression: Left knee x-rays with mild to moderate degenerative changes.  New Medications:  No orders of the defined types were placed in this encounter.     Oliver Barre, MD  07/12/2023 10:11 AM

## 2023-07-13 ENCOUNTER — Other Ambulatory Visit (HOSPITAL_COMMUNITY): Payer: Self-pay | Admitting: Internal Medicine

## 2023-07-13 DIAGNOSIS — Z1231 Encounter for screening mammogram for malignant neoplasm of breast: Secondary | ICD-10-CM

## 2023-07-17 ENCOUNTER — Ambulatory Visit (HOSPITAL_COMMUNITY)
Admission: RE | Admit: 2023-07-17 | Discharge: 2023-07-17 | Disposition: A | Discharge status: Home / Self Care | Payer: Commercial Managed Care - PPO | Source: Ambulatory Visit | Attending: Internal Medicine | Admitting: Internal Medicine

## 2023-07-17 DIAGNOSIS — Z1231 Encounter for screening mammogram for malignant neoplasm of breast: Secondary | ICD-10-CM | POA: Diagnosis not present

## 2023-08-21 ENCOUNTER — Other Ambulatory Visit (HOSPITAL_COMMUNITY): Payer: Self-pay

## 2023-08-21 ENCOUNTER — Ambulatory Visit: Payer: Commercial Managed Care - PPO | Admitting: Adult Health

## 2023-08-21 ENCOUNTER — Other Ambulatory Visit: Payer: Self-pay

## 2023-08-21 ENCOUNTER — Encounter: Payer: Self-pay | Admitting: Adult Health

## 2023-08-21 VITALS — BP 113/76 | HR 91 | Ht 64.0 in | Wt 224.0 lb

## 2023-08-21 DIAGNOSIS — D2272 Melanocytic nevi of left lower limb, including hip: Secondary | ICD-10-CM | POA: Diagnosis not present

## 2023-08-21 DIAGNOSIS — D485 Neoplasm of uncertain behavior of skin: Secondary | ICD-10-CM | POA: Diagnosis not present

## 2023-08-21 DIAGNOSIS — R232 Flushing: Secondary | ICD-10-CM | POA: Insufficient documentation

## 2023-08-21 DIAGNOSIS — F419 Anxiety disorder, unspecified: Secondary | ICD-10-CM

## 2023-08-21 DIAGNOSIS — R1031 Right lower quadrant pain: Secondary | ICD-10-CM

## 2023-08-21 DIAGNOSIS — Z01419 Encounter for gynecological examination (general) (routine) without abnormal findings: Secondary | ICD-10-CM

## 2023-08-21 DIAGNOSIS — D225 Melanocytic nevi of trunk: Secondary | ICD-10-CM | POA: Diagnosis not present

## 2023-08-21 DIAGNOSIS — Z1211 Encounter for screening for malignant neoplasm of colon: Secondary | ICD-10-CM

## 2023-08-21 DIAGNOSIS — Z1331 Encounter for screening for depression: Secondary | ICD-10-CM | POA: Diagnosis not present

## 2023-08-21 DIAGNOSIS — F32A Depression, unspecified: Secondary | ICD-10-CM | POA: Diagnosis not present

## 2023-08-21 DIAGNOSIS — Z79890 Hormone replacement therapy (postmenopausal): Secondary | ICD-10-CM

## 2023-08-21 DIAGNOSIS — Z1283 Encounter for screening for malignant neoplasm of skin: Secondary | ICD-10-CM | POA: Diagnosis not present

## 2023-08-21 LAB — HEMOCCULT GUIAC POC 1CARD (OFFICE): Fecal Occult Blood, POC: NEGATIVE

## 2023-08-21 MED ORDER — DUAVEE 0.45-20 MG PO TABS
1.0000 | ORAL_TABLET | Freq: Every day | ORAL | 3 refills | Status: AC
  Filled 2023-08-21 (×2): qty 90, 90d supply, fill #0
  Filled 2023-10-13 – 2023-11-01 (×3): qty 90, 90d supply, fill #1
  Filled 2024-02-19: qty 90, 90d supply, fill #2
  Filled 2024-04-10 – 2024-05-15 (×2): qty 90, 90d supply, fill #3

## 2023-08-21 MED ORDER — ESCITALOPRAM OXALATE 20 MG PO TABS
20.0000 mg | ORAL_TABLET | Freq: Every day | ORAL | 3 refills | Status: AC
  Filled 2023-08-21 (×2): qty 90, 90d supply, fill #0
  Filled 2023-10-13 – 2023-11-03 (×4): qty 90, 90d supply, fill #1
  Filled 2024-02-19: qty 90, 90d supply, fill #2
  Filled 2024-05-15: qty 90, 90d supply, fill #3

## 2023-08-21 MED ORDER — NYSTATIN 100000 UNIT/GM EX POWD
1.0000 | Freq: Three times a day (TID) | CUTANEOUS | 3 refills | Status: AC
Start: 2023-08-21 — End: ?
  Filled 2023-08-21 (×2): qty 60, 20d supply, fill #0
  Filled 2023-10-13 – 2023-10-29 (×2): qty 60, 20d supply, fill #1
  Filled 2024-02-19: qty 60, 20d supply, fill #2
  Filled 2024-04-10: qty 60, 20d supply, fill #3

## 2023-08-21 MED ORDER — PROGESTERONE 200 MG PO CAPS
200.0000 mg | ORAL_CAPSULE | Freq: Every evening | ORAL | 3 refills | Status: AC
  Filled 2023-08-21 (×2): qty 90, 90d supply, fill #0
  Filled 2023-10-13 – 2023-11-01 (×3): qty 90, 90d supply, fill #1
  Filled 2024-04-10: qty 90, 90d supply, fill #2
  Filled 2024-06-02: qty 90, 90d supply, fill #3

## 2023-08-21 NOTE — Progress Notes (Signed)
 Patient ID: Thomas Hoff, female   DOB: 08/20/1965, 58 y.o.   MRN: [id number] History of Present Illness: Gina Whitaker is a 58 year old white female, married, PM, in for a well woman gyn exam. She is on HRT and still has some hot flashes. Has pain RLQ at times. Son and husband have had the flu.      Component Value Date/Time   DIAGPAP  09/20/2021 0943    - Negative for intraepithelial lesion or malignancy (NILM)   HPVHIGH Negative 09/20/2021 0943   ADEQPAP  09/20/2021 0943    Satisfactory for evaluation; transformation zone component ABSENT.   PCP is Dr Ouida Sills   Current Medications, Allergies, Past Medical History, Past Surgical History, Family History and Social History were reviewed in [employer] [facility] electronic medical record.     Review of Systems: Patient denies any headaches, hearing loss, fatigue, blurred vision, shortness of breath, chest pain, abdominal pain, problems with bowel movements, urination, or intercourse. No joint pain or mood swings.  +hot flashes +RLQ pain Denies any vaginal bleeding    Physical Exam:BP 113/76 (BP Location: Left Arm, Patient Position: Sitting, Cuff Size: Large)   Pulse 91   Ht 5\' 4"  (1.626 m)   Wt 224 lb (101.6 kg)   LMP 09/21/2019   BMI 38.45 kg/m   General:  Well developed, well nourished, no acute distress Skin:  Warm and dry Neck:  Midline trachea, normal thyroid, good ROM, no lymphadenopathy Lungs; Clear to auscultation bilaterally Breast:  No dominant palpable mass, retraction, or nipple discharge Cardiovascular: Regular rate and rhythm Abdomen:  Soft, non tender, no hepatosplenomegaly Pelvic:  External genitalia is normal in appearance, no lesions.  The vagina is normal in appearance. Urethra has no lesions or masses. The cervix is smooth.  Uterus is felt to be normal size, shape, and contour.  No adnexal masses, +RLQ  tenderness noted.Bladder is non tender, no masses felt. Rectal: Good sphincter tone, no polyps, + hemorrhoids felt.   Hemoccult negative. Extremities/musculoskeletal:  No swelling or varicosities noted, no clubbing or cyanosis Psych:  No mood changes, alert and cooperative,seems happy AA is 0 Fall risk is high    08/21/2023   10:33 AM 09/20/2021    9:40 AM 07/19/2021    9:33 AM  Depression screen PHQ 2/9  Decreased Interest 1 0 1  Down, Depressed, Hopeless 1 0 1  PHQ - 2 Score 2 0 2  Altered sleeping 2 0 3  Tired, decreased energy 2 1 3   Change in appetite 1 0 2  Feeling bad or failure about yourself  0 1 3  Trouble concentrating 1 1 3   Moving slowly or fidgety/restless 0 0 3  Suicidal thoughts 0 0 0  PHQ-9 Score 8 3 19   Difficult doing work/chores   Very difficult       08/21/2023   10:34 AM 09/20/2021    9:40 AM 07/19/2021    9:36 AM 11/05/2019    8:46 AM  GAD 7 : Generalized Anxiety Score  Nervous, Anxious, on Edge 2 1 1 1   Control/stop worrying 0 0 3 1  Worry too much - different things 1 0 3 0  Trouble relaxing 1 0 3 0  Restless 0 0 1 0  Easily annoyed or irritable 2 1 3  0  Afraid - awful might happen 1 0 3 1  Total GAD 7 Score 7 2 17 3   Anxiety Difficulty    Not difficult at all      Upstream -  08/21/23 1044       Pregnancy Intention Screening   Does the patient want to become pregnant in the next year? No    Does the patient's partner want to become pregnant in the next year? No    Would the patient like to discuss contraceptive options today? No      Contraception Wrap Up   Current Method Withdrawal or Other Method   ablation; PM   End Method Withdrawal or Other Method   ablation; PM   Contraception Counseling Provided No             Examination chaperoned by Malachy Mood LPN  Impression and plan: 1. Encounter for well woman exam with routine gynecological exam (Primary) Pap and physical in 1 year Mammogram was negative 07/17/23 Colonoscopy due 2028 Labs with PCP She asked for rx for nystatin powder to use under pannus when sweating, has used cotton cloth to help with  moisture   2. Encounter for screening fecal occult blood testing Hemoccult was negative  - POCT occult blood stool  3. Hormone replacement therapy (HRT) Refilled duavee and Prometrium 200 mg   Meds ordered this encounter  Medications   Conj Estrogens-Bazedoxifene (DUAVEE) 0.45-20 MG TABS    Sig: Take 1 tablet by mouth daily.    Dispense:  90 tablet    Refill:  3    Supervising Provider:   Duane Lope H [[id number]]   progesterone (PROMETRIUM) 200 MG capsule    Sig: Take 1 capsule (200 mg total) by mouth Nightly.    Dispense:  90 capsule    Refill:  3    Supervising Provider:   Duane Lope H [[id number]]   escitalopram (LEXAPRO) 20 MG tablet    Sig: Take 1 tablet (20 mg total) by mouth daily.    Dispense:  90 tablet    Refill:  3    Supervising Provider:   Duane Lope H [[id number]]   nystatin (MYCOSTATIN/NYSTOP) powder    Sig: Apply 1 Application topically 3 (three) times daily.    Dispense:  60 g    Refill:  3    Supervising Provider:   Despina Hidden, LUTHER H [[id number]]     4. RLQ abdominal pain +RLQ Pain at times Will get pelvic US in office in about 2 weeks to assess  - US PELVIC COMPLETE WITH TRANSVAGINAL; Future  5. Anxiety and depression Took 2 lexapro accidentally and felt better Will increase lexapro to 20 mg 1 daily   6. Hot flashes Continue Duavee and prometrium

## 2023-08-22 ENCOUNTER — Other Ambulatory Visit (HOSPITAL_COMMUNITY): Payer: Self-pay

## 2023-08-22 ENCOUNTER — Other Ambulatory Visit: Payer: Self-pay

## 2023-09-04 ENCOUNTER — Ambulatory Visit: Admitting: Radiology

## 2023-09-04 DIAGNOSIS — R1031 Right lower quadrant pain: Secondary | ICD-10-CM

## 2023-09-04 NOTE — Progress Notes (Signed)
 GYN US : TA and TV imaging performed - vinyl probe cover used - Chaperone: Anteverted uterus, slightly enlarged with single intramural fibroid measuring 42 x 36 x 26 mm Fibroid abutts mid posterior endometrial wall and obscures the mid cavity. Endom thickness = 3.9 mm, avascular cavity and canal.  No evidence of intracavitary soft tissue defect Nl ov's, mobile, neg adnexal regions, neg CDS, no free fluid present

## 2023-10-13 ENCOUNTER — Other Ambulatory Visit: Payer: Self-pay

## 2023-10-13 ENCOUNTER — Encounter: Payer: Self-pay | Admitting: Pharmacist

## 2023-10-18 ENCOUNTER — Other Ambulatory Visit: Payer: Self-pay

## 2023-10-24 ENCOUNTER — Other Ambulatory Visit (HOSPITAL_COMMUNITY): Payer: Self-pay

## 2023-10-30 ENCOUNTER — Other Ambulatory Visit: Payer: Self-pay

## 2023-10-30 ENCOUNTER — Other Ambulatory Visit (HOSPITAL_COMMUNITY): Payer: Self-pay

## 2023-10-31 ENCOUNTER — Other Ambulatory Visit: Payer: Self-pay

## 2023-11-01 ENCOUNTER — Other Ambulatory Visit (HOSPITAL_COMMUNITY): Payer: Self-pay

## 2023-11-01 ENCOUNTER — Other Ambulatory Visit: Payer: Self-pay

## 2023-11-03 ENCOUNTER — Other Ambulatory Visit: Payer: Self-pay

## 2023-11-10 ENCOUNTER — Other Ambulatory Visit: Payer: Self-pay

## 2024-01-20 ENCOUNTER — Other Ambulatory Visit (HOSPITAL_COMMUNITY): Payer: Self-pay

## 2024-01-21 ENCOUNTER — Other Ambulatory Visit (HOSPITAL_COMMUNITY): Payer: Self-pay

## 2024-01-22 ENCOUNTER — Other Ambulatory Visit (HOSPITAL_COMMUNITY): Payer: Self-pay

## 2024-01-22 ENCOUNTER — Encounter (HOSPITAL_COMMUNITY): Payer: Self-pay

## 2024-01-22 ENCOUNTER — Other Ambulatory Visit: Payer: Self-pay

## 2024-01-22 MED ORDER — ACYCLOVIR 400 MG PO TABS
400.0000 mg | ORAL_TABLET | Freq: Every day | ORAL | 6 refills | Status: AC
  Filled 2024-01-22: qty 25, 5d supply, fill #0
  Filled 2024-02-19: qty 25, 5d supply, fill #1
  Filled 2024-04-10: qty 25, 5d supply, fill #2
  Filled 2024-05-15: qty 25, 5d supply, fill #3
  Filled 2024-06-02: qty 25, 5d supply, fill #4

## 2024-01-24 ENCOUNTER — Other Ambulatory Visit: Payer: Self-pay

## 2024-02-19 ENCOUNTER — Other Ambulatory Visit: Payer: Self-pay

## 2024-02-19 ENCOUNTER — Other Ambulatory Visit (HOSPITAL_COMMUNITY): Payer: Self-pay

## 2024-04-10 ENCOUNTER — Other Ambulatory Visit: Payer: Self-pay

## 2024-04-10 ENCOUNTER — Other Ambulatory Visit (HOSPITAL_COMMUNITY): Payer: Self-pay

## 2024-05-17 ENCOUNTER — Other Ambulatory Visit (HOSPITAL_COMMUNITY): Payer: Self-pay

## 2024-06-03 ENCOUNTER — Other Ambulatory Visit: Payer: Self-pay

## 2024-06-12 ENCOUNTER — Encounter: Payer: Self-pay | Admitting: Orthopedic Surgery

## 2024-06-12 ENCOUNTER — Ambulatory Visit: Admitting: Orthopedic Surgery

## 2024-06-12 ENCOUNTER — Other Ambulatory Visit: Payer: Self-pay

## 2024-06-12 VITALS — BP 145/96 | HR 77 | Ht 64.0 in | Wt 225.0 lb

## 2024-06-12 DIAGNOSIS — M25312 Other instability, left shoulder: Secondary | ICD-10-CM

## 2024-06-12 DIAGNOSIS — M25512 Pain in left shoulder: Secondary | ICD-10-CM | POA: Diagnosis not present

## 2024-06-12 NOTE — Patient Instructions (Addendum)
 Central Scheduling [phone]     Instructions Following Joint Injections  In clinic today, you received an injection in one of your joints (sometimes more than one).  Occasionally, you can have some pain at the injection site, this is normal.  You can place ice at the injection site, or take over-the-counter medications such as Tylenol  (acetaminophen ) or Advil  (ibuprofen ).  Please follow all directions listed on the bottle.  If your joint (knee or shoulder) becomes swollen, red or very painful, please contact the clinic for additional assistance.   Two medications were injected, including lidocaine  and a steroid (often referred to as cortisone).  Lidocaine  is effective almost immediately but wears off quickly.  However, the steroid can take a few days to improve your symptoms.  In some cases, it can make your pain worse for a couple of days.  Do not be concerned if this happens as it is common.  You can apply ice or take some over-the-counter medications as needed.   Injections in the same joint cannot be repeated for 3 months.  This helps to limit the risk of an infection in the joint.  If you were to develop an infection in your joint, the best treatment option would be surgery.
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
## 2024-06-15 ENCOUNTER — Ambulatory Visit (HOSPITAL_COMMUNITY)
Admission: RE | Admit: 2024-06-15 | Discharge: 2024-06-15 | Disposition: A | Source: Ambulatory Visit | Attending: Orthopedic Surgery

## 2024-06-15 DIAGNOSIS — M25312 Other instability, left shoulder: Secondary | ICD-10-CM | POA: Insufficient documentation

## 2024-06-18 ENCOUNTER — Ambulatory Visit (HOSPITAL_COMMUNITY)

## 2024-08-22 ENCOUNTER — Ambulatory Visit: Admitting: Adult Health
